# Patient Record
Sex: Male | Born: 1965 | Race: White | Hispanic: No | Marital: Married | Smoking: Never smoker
Health system: Southern US, Community
[De-identification: ages and names within clinical notes are randomized; demographics above are authoritative.]

## PROBLEM LIST (undated history)

## (undated) DIAGNOSIS — M109 Gout, unspecified: Secondary | ICD-10-CM

## (undated) DIAGNOSIS — Z8719 Personal history of other diseases of the digestive system: Secondary | ICD-10-CM

## (undated) DIAGNOSIS — Z8711 Personal history of peptic ulcer disease: Secondary | ICD-10-CM

## (undated) DIAGNOSIS — I10 Essential (primary) hypertension: Secondary | ICD-10-CM

## (undated) DIAGNOSIS — M5135 Other intervertebral disc degeneration, thoracolumbar region: Secondary | ICD-10-CM

## (undated) DIAGNOSIS — G4733 Obstructive sleep apnea (adult) (pediatric): Secondary | ICD-10-CM

## (undated) DIAGNOSIS — S83209A Unspecified tear of unspecified meniscus, current injury, unspecified knee, initial encounter: Secondary | ICD-10-CM

## (undated) DIAGNOSIS — Z9989 Dependence on other enabling machines and devices: Secondary | ICD-10-CM

## (undated) DIAGNOSIS — G4730 Sleep apnea, unspecified: Secondary | ICD-10-CM

## (undated) HISTORY — PX: LAPAROSCOPIC CHOLECYSTECTOMY: SUR755

## (undated) HISTORY — DX: Sleep apnea, unspecified: G47.30

## (undated) HISTORY — DX: Gout, unspecified: M10.9

---
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
## 1987-05-19 HISTORY — PX: ANTERIOR CRUCIATE LIGAMENT REPAIR: SHX115

## 2007-08-25 ENCOUNTER — Encounter: Payer: Self-pay | Admitting: Pulmonary Disease

## 2007-08-29 ENCOUNTER — Ambulatory Visit (HOSPITAL_COMMUNITY): Admission: RE | Admit: 2007-08-29 | Discharge: 2007-08-29 | Payer: Self-pay | Admitting: Cardiovascular Disease

## 2007-08-29 ENCOUNTER — Emergency Department (HOSPITAL_COMMUNITY): Admission: EM | Admit: 2007-08-29 | Discharge: 2007-08-29 | Payer: Self-pay | Admitting: Emergency Medicine

## 2007-08-29 ENCOUNTER — Encounter: Payer: Self-pay | Admitting: Pulmonary Disease

## 2007-09-12 ENCOUNTER — Ambulatory Visit: Payer: Self-pay | Admitting: Pulmonary Disease

## 2007-09-12 DIAGNOSIS — R51 Headache: Secondary | ICD-10-CM

## 2007-09-12 DIAGNOSIS — I499 Cardiac arrhythmia, unspecified: Secondary | ICD-10-CM | POA: Insufficient documentation

## 2007-09-12 DIAGNOSIS — R0602 Shortness of breath: Secondary | ICD-10-CM | POA: Insufficient documentation

## 2007-09-12 DIAGNOSIS — G4730 Sleep apnea, unspecified: Secondary | ICD-10-CM | POA: Insufficient documentation

## 2007-09-12 DIAGNOSIS — R519 Headache, unspecified: Secondary | ICD-10-CM | POA: Insufficient documentation

## 2007-09-12 DIAGNOSIS — I10 Essential (primary) hypertension: Secondary | ICD-10-CM

## 2007-09-19 ENCOUNTER — Inpatient Hospital Stay (HOSPITAL_BASED_OUTPATIENT_CLINIC_OR_DEPARTMENT_OTHER): Admission: RE | Admit: 2007-09-19 | Discharge: 2007-09-19 | Payer: Self-pay | Admitting: Cardiovascular Disease

## 2007-09-22 ENCOUNTER — Ambulatory Visit: Payer: Self-pay | Admitting: Pulmonary Disease

## 2007-09-26 ENCOUNTER — Encounter: Payer: Self-pay | Admitting: Pulmonary Disease

## 2007-09-28 HISTORY — PX: CARDIAC CATHETERIZATION: SHX172

## 2007-10-03 ENCOUNTER — Ambulatory Visit: Admission: RE | Admit: 2007-10-03 | Discharge: 2007-10-03 | Payer: Self-pay | Admitting: Pulmonary Disease

## 2007-10-06 ENCOUNTER — Telehealth (INDEPENDENT_AMBULATORY_CARE_PROVIDER_SITE_OTHER): Payer: Self-pay | Admitting: *Deleted

## 2007-10-17 ENCOUNTER — Telehealth: Payer: Self-pay | Admitting: Pulmonary Disease

## 2007-10-18 ENCOUNTER — Encounter (INDEPENDENT_AMBULATORY_CARE_PROVIDER_SITE_OTHER): Payer: Self-pay | Admitting: *Deleted

## 2007-10-20 ENCOUNTER — Ambulatory Visit (HOSPITAL_COMMUNITY): Admission: RE | Admit: 2007-10-20 | Discharge: 2007-10-20 | Payer: Self-pay | Admitting: Pulmonary Disease

## 2007-10-20 ENCOUNTER — Encounter: Payer: Self-pay | Admitting: Pulmonary Disease

## 2007-10-31 ENCOUNTER — Ambulatory Visit: Payer: Self-pay | Admitting: Internal Medicine

## 2007-11-03 ENCOUNTER — Telehealth: Payer: Self-pay | Admitting: Pulmonary Disease

## 2007-11-16 ENCOUNTER — Ambulatory Visit: Payer: Self-pay | Admitting: Pulmonary Disease

## 2007-12-09 ENCOUNTER — Ambulatory Visit: Payer: Self-pay | Admitting: Pulmonary Disease

## 2008-07-19 ENCOUNTER — Encounter (HOSPITAL_COMMUNITY): Admission: RE | Admit: 2008-07-19 | Discharge: 2008-10-17 | Payer: Self-pay | Admitting: Internal Medicine

## 2010-09-30 NOTE — H&P (Signed)
NAME:  [name], [name] NO.:  [id number]   MEDICAL RECORD NO.:  [id number]           PATIENT TYPE:   LOCATION:                                 FACILITY:   PHYSICIAN:  Vesta Mixer, M.D. DATE OF BIRTH:  1965-06-02   DATE OF ADMISSION:  DATE OF DISCHARGE:                              HISTORY & PHYSICAL   Melvin Ford is a 45 year old gentleman with a history of chest pain and  shortness of breath.  We are admitting him for heart catheterization  after he had an abnormal Cardiolite study.   Melvin Ford is a middle-aged gentleman with a history of obesity, sleep apnea,  and some pulmonary problems.  He started having episodes of chest  tightness and shortness of breath about a month or so ago.  These  episodes are described as severe tightness and shortness of breath with  minimal exercise.  These episodes typically occur with exertion and are  limited greatly.  He denies any PND or orthopnea.  He has been fairly  limited and is not able to do much in the way of any type of exercise.   We performed a stress Cardiolite study which revealed apical ischemia.  He had normal left ventricular systolic function.   He denies any syncope or presyncope.   CURRENT MEDICATIONS:  1. Allopurinol 300 mg a day.  2. Aspirin 81 mg a day.  3. CPAP at night.   ALLERGIES:  NONE.   PAST MEDICAL HISTORY:  1. Sleep apnea.  2. Obesity.   SOCIAL HISTORY:  The patient is a nonsmoker.   FAMILY HISTORY:  Noncontributory.   PHYSICAL EXAMINATION:  GENERAL:  On exam, he is a middle-aged gentleman.  He is in no acute distress.  He is alert and oriented x3, and his mood  and affect are normal.  VITAL SIGNS:  His weight is 335, blood pressure is 128/84 with heart  rate of 82.  HEENT:  Reveals 2+ carotids.  He has no bruits, no JVD, no thyromegaly.  LUNGS:  Clear to auscultation.  HEART:  Regular rate.  S1, S2.  ABDOMEN:  Reveals good bowel sounds and is nontender.  EXTREMITIES:  He has no  clubbing, cyanosis, or edema.  NEURO:  Nonfocal.   Melvin Ford presents with chest pains and some shortness of breath.  His  Cardiolite study is somewhat atypical in that it is a very small area of  ischemia, nevertheless, his symptoms are quite impressive.  I have  suggested that we proceed with a diagnostic heart catheterization.  We  have discussed the risks, benefits, and options of heart  catheterization.  He understands and agrees to proceed.  All of his  other medical problems remain fairly stable.           ______________________________  Vesta Mixer, M.D.     PJN/MEDQ  D:  09/14/2007  T:  09/15/2007  Job:  [id number]   cc:   Pricilla Holm, M.D.

## 2010-09-30 NOTE — H&P (Signed)
NAME:  [name], [name] NO.:  [id number]   MEDICAL RECORD NO.:  [id number]           PATIENT TYPE:   LOCATION:                                 FACILITY:   PHYSICIAN:  Vesta Mixer, M.D. DATE OF BIRTH:  Mar 19, 1966   DATE OF ADMISSION:  09/14/2007  DATE OF DISCHARGE:                              HISTORY & PHYSICAL   Melvin Ford is a middle-aged gentleman with a recent onset of shortness  of breath and chest discomfort.  He recently had a stress Cardiolite  study, which was abnormal.  He is    Dictation ended at this point.           ______________________________  Vesta Mixer, M.D.     PJN/MEDQ  D:  09/14/2007  T:  09/15/2007  Job:  [id number]   cc:   Pricilla Holm, M.D.

## 2010-09-30 NOTE — Cardiovascular Report (Signed)
NAMENIEKO, CLARIN NO.:  [id number]   MEDICAL RECORD NO.:  [id number]          PATIENT TYPE:  OIB   LOCATION:  1962                         FACILITY:  [facility]   PHYSICIAN:  Vesta Mixer, M.D. DATE OF BIRTH:  Sep 01, 1965   DATE OF PROCEDURE:  09/19/2007  DATE OF DISCHARGE:  09/19/2007                            CARDIAC CATHETERIZATION   Melvin Ford is a middle-aged gentleman with a recent onset of chest pain  and shortness of breath.  He is referred for heart catheterization for  further evaluation of his symptoms.   The procedure was left heart catheterization with coronary angiography.  The right femoral artery was easily cannulated using modified Seldinger  technique.  A 4-French sheath was placed.  A Judkins left 5 was used to  cannulate the left coronary artery.  A 85 mL of contrast were used.   HEMODYNAMIC:  Left ventricular pressure is 142/21.  His aortic pressure  is 142/100.   Angiography left main:  The left main is fairly normal.   The left anterior descending artery is a large and tortuous vessel.  It  is fairly smooth and normal throughout its course.  It gives off a  moderate-sized diagonal branch, which is just essentially normal.  The  second diagonal vessel is also a moderate-sized vessel and has a minor  luminal irregularity in the proximal segment.  The remaining part of the  LAD is quite tortuous, but is otherwise normal as it reaches down to the  apex.  There is a small septal branch, which has minor luminal  irregularities.   The left circumflex artery is a very small vessel.  It supplies very  small first obtuse marginal artery, which does not supply much  myocardium.  The AV continuation branch of the circumflex vessel is  fairly small.   The right coronary artery is extremely large and is dominant.  It is  fairly tortuous throughout its course.  It gives off a small to moderate  sized posterior descending artery, which is  unremarkable.  There is a  large posterolateral system, which is very tortuous, but is otherwise  unremarkable.   The left ventriculogram was performed in the 30 RAO position.  It  reveals mild to moderate left ventricular enlargement.  The left  ventricular systolic function is normal.  The ejection fraction is  around 55%.  There is no significant mitral regurgitation.   COMPLICATIONS:  None.   CONCLUSION:  Minimal coronary artery irregularities.  Its vessels are  fairly tortuous, which may be due to longstanding hypertension or  perhaps his obesity.  His overall left ventricular systolic function is  normal.  We will continue with medical management.  He is scheduled to  see the pulmonologist for further evaluation.           ______________________________  Vesta Mixer, M.D.     PJN/MEDQ  D:  09/28/2007  T:  09/29/2007  Job:  [id number]   cc:   Barbaraann Share, MD,FCCP  Bevelyn Buckles. Bensimhon, MD  Pricilla Holm, M.D.

## 2011-02-10 LAB — CBC
HCT: 47
Hemoglobin: 15.9
MCHC: 33.9
RBC: 5.35
RDW: 13.1

## 2011-02-10 LAB — POCT I-STAT, CHEM 8
Chloride: 105
Glucose, Bld: 94
HCT: 48
Potassium: 4.5
Sodium: 137

## 2011-02-10 LAB — B-NATRIURETIC PEPTIDE (CONVERTED LAB): Pro B Natriuretic peptide (BNP): <30

## 2011-02-10 LAB — POCT CARDIAC MARKERS: CKMB, poc: <1 — ABNORMAL LOW

## 2011-06-19 ENCOUNTER — Other Ambulatory Visit: Payer: Self-pay | Admitting: Pain Medicine

## 2011-06-19 NOTE — Progress Notes (Signed)
PT TO COME ON Monday 06-22-11 TO BE WEIGHED . STATES HE IS 340LB AND 6FT AND HAS OSA. WILL DO HEALTH HX AND GIVE INSTRUCTIONS THEN.

## 2011-06-22 ENCOUNTER — Encounter (HOSPITAL_BASED_OUTPATIENT_CLINIC_OR_DEPARTMENT_OTHER): Payer: Self-pay | Admitting: *Deleted

## 2011-06-22 NOTE — Progress Notes (Signed)
NPO AFTER MN WITH EXCEPTION CLEAR LIQUIDS (NO CREAM/ MILD PRODUCTS) UNTIL 0830. ARRIVES AT 1245. NEEDS HH AND EKG. REVIEWED [facility] GUIDELINES, WILL BRING CPAP.

## 2011-06-24 NOTE — Progress Notes (Signed)
REVIEWED CHART W/ DR CARIGNAN MDA, OK TO PROCEED.

## 2011-06-26 ENCOUNTER — Ambulatory Visit (HOSPITAL_BASED_OUTPATIENT_CLINIC_OR_DEPARTMENT_OTHER)
Admission: RE | Admit: 2011-06-26 | Discharge: 2011-06-26 | Disposition: A | Discharge status: Home / Self Care | Payer: 59 | Source: Ambulatory Visit | Attending: Specialist | Admitting: Specialist

## 2011-06-26 ENCOUNTER — Ambulatory Visit (HOSPITAL_BASED_OUTPATIENT_CLINIC_OR_DEPARTMENT_OTHER): Payer: 59 | Admitting: Anesthesiology

## 2011-06-26 ENCOUNTER — Encounter (HOSPITAL_BASED_OUTPATIENT_CLINIC_OR_DEPARTMENT_OTHER): Payer: Self-pay | Admitting: *Deleted

## 2011-06-26 ENCOUNTER — Other Ambulatory Visit: Payer: Self-pay

## 2011-06-26 ENCOUNTER — Encounter (HOSPITAL_BASED_OUTPATIENT_CLINIC_OR_DEPARTMENT_OTHER)
Admission: RE | Disposition: A | Discharge status: Home / Self Care | Payer: Self-pay | Source: Ambulatory Visit | Attending: Specialist

## 2011-06-26 ENCOUNTER — Encounter (HOSPITAL_BASED_OUTPATIENT_CLINIC_OR_DEPARTMENT_OTHER): Payer: Self-pay | Admitting: Anesthesiology

## 2011-06-26 ENCOUNTER — Other Ambulatory Visit: Payer: Self-pay | Admitting: Specialist

## 2011-06-26 DIAGNOSIS — Z79899 Other long term (current) drug therapy: Secondary | ICD-10-CM | POA: Insufficient documentation

## 2011-06-26 DIAGNOSIS — R0602 Shortness of breath: Secondary | ICD-10-CM

## 2011-06-26 DIAGNOSIS — M1710 Unilateral primary osteoarthritis, unspecified knee: Secondary | ICD-10-CM | POA: Insufficient documentation

## 2011-06-26 DIAGNOSIS — I499 Cardiac arrhythmia, unspecified: Secondary | ICD-10-CM

## 2011-06-26 DIAGNOSIS — I10 Essential (primary) hypertension: Secondary | ICD-10-CM | POA: Insufficient documentation

## 2011-06-26 DIAGNOSIS — G4730 Sleep apnea, unspecified: Secondary | ICD-10-CM

## 2011-06-26 DIAGNOSIS — M2240 Chondromalacia patellae, unspecified knee: Secondary | ICD-10-CM | POA: Insufficient documentation

## 2011-06-26 DIAGNOSIS — G4733 Obstructive sleep apnea (adult) (pediatric): Secondary | ICD-10-CM | POA: Insufficient documentation

## 2011-06-26 DIAGNOSIS — S83289A Other tear of lateral meniscus, current injury, unspecified knee, initial encounter: Secondary | ICD-10-CM | POA: Insufficient documentation

## 2011-06-26 DIAGNOSIS — X58XXXA Exposure to other specified factors, initial encounter: Secondary | ICD-10-CM | POA: Insufficient documentation

## 2011-06-26 DIAGNOSIS — M6580 Other synovitis and tenosynovitis, unspecified site: Secondary | ICD-10-CM | POA: Insufficient documentation

## 2011-06-26 DIAGNOSIS — R51 Headache: Secondary | ICD-10-CM

## 2011-06-26 HISTORY — DX: Unspecified tear of unspecified meniscus, current injury, unspecified knee, initial encounter: S83.209A

## 2011-06-26 HISTORY — DX: Essential (primary) hypertension: I10

## 2011-06-26 HISTORY — DX: Dependence on other enabling machines and devices: Z99.89

## 2011-06-26 HISTORY — PX: KNEE ARTHROSCOPY W/ MENISCAL REPAIR: SHX1877

## 2011-06-26 HISTORY — DX: Obstructive sleep apnea (adult) (pediatric): G47.33

## 2011-06-26 HISTORY — DX: Personal history of other diseases of the digestive system: Z87.19

## 2011-06-26 HISTORY — DX: Personal history of peptic ulcer disease: Z87.11

## 2011-06-26 HISTORY — DX: Other intervertebral disc degeneration, thoracolumbar region: M51.35

## 2011-06-26 LAB — POCT HEMOGLOBIN-HEMACUE: Hemoglobin: 15.2 g/dL (ref 13.0–17.0)

## 2011-06-26 SURGERY — ARTHROSCOPY, KNEE, WITH MEDIAL MENISCECTOMY
Anesthesia: General | Site: Knee | Laterality: Left | Wound class: Clean

## 2011-06-26 MED ORDER — CEPHALEXIN 500 MG PO CAPS: 500.0000 mg | ORAL_CAPSULE | Freq: Three times a day (TID) | ORAL | Status: AC

## 2011-06-26 MED ORDER — CHLORHEXIDINE GLUCONATE 4 % EX LIQD: 60.0000 mL | Freq: Once | CUTANEOUS | Status: DC

## 2011-06-26 MED ORDER — LIDOCAINE HCL (CARDIAC) 20 MG/ML IV SOLN
INTRAVENOUS | Status: DC | PRN
  Administered 2011-06-26: 100 mg via INTRAVENOUS

## 2011-06-26 MED ORDER — ONDANSETRON HCL 4 MG/2ML IJ SOLN
INTRAMUSCULAR | Status: DC | PRN
  Administered 2011-06-26: 4 mg via INTRAVENOUS

## 2011-06-26 MED ORDER — POVIDONE-IODINE 7.5 % EX SOLN: Freq: Once | CUTANEOUS | Status: DC

## 2011-06-26 MED ORDER — BUPIVACAINE HCL (PF) 0.25 % IJ SOLN
INTRAMUSCULAR | Status: DC | PRN
  Administered 2011-06-26: 20 mL

## 2011-06-26 MED ORDER — FENTANYL CITRATE 0.05 MG/ML IJ SOLN
25.0000 ug | INTRAMUSCULAR | Status: DC | PRN
  Administered 2011-06-26 (×2): 50 ug via INTRAVENOUS

## 2011-06-26 MED ORDER — LACTATED RINGERS IV SOLN
INTRAVENOUS | Status: DC
  Administered 2011-06-26 (×2): via INTRAVENOUS

## 2011-06-26 MED ORDER — MORPHINE SULFATE 4 MG/ML IJ SOLN
INTRAMUSCULAR | Status: DC | PRN
  Administered 2011-06-26: 4 mg via INTRAVENOUS

## 2011-06-26 MED ORDER — SODIUM CHLORIDE 0.9 % IV SOLN: INTRAVENOUS | Status: DC

## 2011-06-26 MED ORDER — FENTANYL CITRATE 0.05 MG/ML IJ SOLN
INTRAMUSCULAR | Status: DC | PRN
  Administered 2011-06-26 (×2): 50 ug via INTRAVENOUS
  Administered 2011-06-26: 25 ug via INTRAVENOUS
  Administered 2011-06-26 (×2): 50 ug via INTRAVENOUS
  Administered 2011-06-26: 25 ug via INTRAVENOUS
  Administered 2011-06-26: 50 ug via INTRAVENOUS

## 2011-06-26 MED ORDER — HYDROCODONE-ACETAMINOPHEN 5-325 MG PO TABS: 1.0000 | ORAL_TABLET | ORAL | Status: AC | PRN

## 2011-06-26 MED ORDER — PROPOFOL 10 MG/ML IV EMUL
INTRAVENOUS | Status: DC | PRN
  Administered 2011-06-26: 300 mg via INTRAVENOUS

## 2011-06-26 MED ORDER — MIDAZOLAM HCL 5 MG/5ML IJ SOLN
INTRAMUSCULAR | Status: DC | PRN
  Administered 2011-06-26: 2 mg via INTRAVENOUS

## 2011-06-26 MED ORDER — CEFAZOLIN SODIUM-DEXTROSE 2-3 GM-% IV SOLR
2.0000 g | INTRAVENOUS | Status: AC
  Administered 2011-06-26: 2 g via INTRAVENOUS

## 2011-06-26 MED ORDER — PROMETHAZINE HCL 25 MG/ML IJ SOLN: 6.2500 mg | INTRAMUSCULAR | Status: DC | PRN

## 2011-06-26 MED ORDER — DEXAMETHASONE SODIUM PHOSPHATE 4 MG/ML IJ SOLN
INTRAMUSCULAR | Status: DC | PRN
  Administered 2011-06-26: 10 mg via INTRAVENOUS

## 2011-06-26 MED ORDER — SODIUM CHLORIDE 0.9 % IR SOLN
Status: DC | PRN
  Administered 2011-06-26: 9000 mL

## 2011-06-26 SURGICAL SUPPLY — 49 items
BANDAGE ELASTIC 6 VELCRO ST LF (GAUZE/BANDAGES/DRESSINGS) ×3 IMPLANT
BANDAGE ESMARK 6X9 LF (GAUZE/BANDAGES/DRESSINGS) IMPLANT
BANDAGE GAUZE ELAST BULKY 4 IN (GAUZE/BANDAGES/DRESSINGS) ×6 IMPLANT
BLADE 4.2CUDA (BLADE) IMPLANT
BLADE CUDA GRT WHITE 3.5 (BLADE) ×3 IMPLANT
BLADE CUDA SHAVER 3.5 (BLADE) ×3 IMPLANT
BNDG CMPR 9X6 STRL LF SNTH (GAUZE/BANDAGES/DRESSINGS)
BNDG ESMARK 6X9 LF (GAUZE/BANDAGES/DRESSINGS)
CANISTER SUCT LVC 12 LTR MEDI- (MISCELLANEOUS) ×3 IMPLANT
CANISTER SUCTION 1200CC (MISCELLANEOUS) IMPLANT
CLOTH BEACON ORANGE TIMEOUT ST (SAFETY) ×3 IMPLANT
DRAPE ARTHROSCOPY W/POUCH 114 (DRAPES) ×3 IMPLANT
DRAPE INCISE 23X17 IOBAN STRL (DRAPES) ×1
DRAPE INCISE IOBAN 23X17 STRL (DRAPES) ×2 IMPLANT
DRAPE U-SHAPE 47X51 STRL (DRAPES) ×3 IMPLANT
DRSG PAD ABDOMINAL 8X10 ST (GAUZE/BANDAGES/DRESSINGS) ×3 IMPLANT
DRSG XEROFORM 1X8 (GAUZE/BANDAGES/DRESSINGS) ×3 IMPLANT
DURAPREP 26ML APPLICATOR (WOUND CARE) ×3 IMPLANT
ELECT MENISCUS 165MM 90D (ELECTRODE) IMPLANT
ELECT REM PT RETURN 9FT ADLT (ELECTROSURGICAL)
ELECTRODE REM PT RTRN 9FT ADLT (ELECTROSURGICAL) IMPLANT
GAUZE KERLIX 2  STERILE LF (GAUZE/BANDAGES/DRESSINGS) ×3 IMPLANT
GAUZE XEROFORM 1X8 LF (GAUZE/BANDAGES/DRESSINGS) ×3 IMPLANT
GLOVE BIOGEL PI IND STRL 6.5 (GLOVE) ×4 IMPLANT
GLOVE BIOGEL PI INDICATOR 6.5 (GLOVE) ×2
GLOVE INDICATOR 8.0 STRL GRN (GLOVE) ×6 IMPLANT
GLOVE SURG ORTHO 8.0 STRL STRW (GLOVE) ×6 IMPLANT
GOWN PREVENTION PLUS LG XLONG (DISPOSABLE) ×6 IMPLANT
GOWN STRL REIN XL XLG (GOWN DISPOSABLE) ×3 IMPLANT
IMMOBILIZER KNEE 22 UNIV (SOFTGOODS) IMPLANT
IMMOBILIZER KNEE 24 THIGH 36 (MISCELLANEOUS) IMPLANT
IMMOBILIZER KNEE 24 UNIV (MISCELLANEOUS)
IV NS IRRIG 3000ML ARTHROMATIC (IV SOLUTION) ×9 IMPLANT
KNEE WRAP E Z 3 GEL PACK (MISCELLANEOUS) ×3 IMPLANT
MINI VAC (SURGICAL WAND) ×3 IMPLANT
PACK ARTHROSCOPY DSU (CUSTOM PROCEDURE TRAY) ×3 IMPLANT
PACK BASIN DAY SURGERY FS (CUSTOM PROCEDURE TRAY) ×3 IMPLANT
PADDING CAST ABS 4INX4YD NS (CAST SUPPLIES) ×1
PADDING CAST ABS COTTON 4X4 ST (CAST SUPPLIES) ×2 IMPLANT
PENCIL BUTTON HOLSTER BLD 10FT (ELECTRODE) IMPLANT
SET ARTHROSCOPY TUBING (MISCELLANEOUS) ×2
SET ARTHROSCOPY TUBING LN (MISCELLANEOUS) ×2 IMPLANT
SPONGE GAUZE 4X4 12PLY (GAUZE/BANDAGES/DRESSINGS) ×3 IMPLANT
SUT ETHILON 3 0 FSL (SUTURE) ×3 IMPLANT
SYR CONTROL 10ML LL (SYRINGE) ×3 IMPLANT
TOWEL OR 17X24 6PK STRL BLUE (TOWEL DISPOSABLE) ×3 IMPLANT
WAND 30 DEG SABER W/CORD (SURGICAL WAND) IMPLANT
WAND 90 DEG TURBOVAC W/CORD (SURGICAL WAND) IMPLANT
WATER STERILE IRR 500ML POUR (IV SOLUTION) ×3 IMPLANT

## 2011-06-26 NOTE — H&P (Signed)
Melvin Ford is an 46 y.o. male.   Chief Complaint:left knee pain HPI: 6 month h/o left knee pain and severe mechanical sx  Unrelieved with cons measures.  Past Medical History  Diagnosis Date  . Acute meniscal tear of knee left  . OSA on CPAP   . History of gastric ulcer   . History of esophageal stricture   . DDD (degenerative disc disease), thoracolumbar   . Hypertension NO MEDS    Past Surgical History  Procedure Date  . Laparoscopic cholecystectomy 14 yrs ago  . Anterior cruciate ligament repair 1989    RIGHT  . Pilonidal cyst excision 1986  . Cardiac catheterization 09-28-2007    MINIMAL CORONARY ARTERY IRREGULARITIES/ VESSELS ARE FAIRLY TORTOUS (MAY BE DUE TO LONGSTANDING HTN OR OBESITY/ NORMAL LVSF    History reviewed. No pertinent family history. Social History:  reports that he has never smoked. He has never used smokeless tobacco. He reports that he drinks about 12.6 ounces of alcohol per week. He reports that he does not use illicit drugs.  Allergies:  Allergies  Allergen Reactions  . Allopurinol Other (See Comments)    Long-term effect  Muscle atrophy    Medications Prior to Admission  Medication Dose Route Frequency Provider Last Rate Last Dose  . 0.9 %  sodium chloride infusion   Intravenous Continuous Jamelle Rushing, PA      . ceFAZolin (ANCEF) IVPB 2 g/50 mL premix  2 g Intravenous 60 min Pre-Op Jamelle Rushing, PA      . chlorhexidine (HIBICLENS) 4 % liquid 4 application  60 mL Topical Once Jamelle Rushing, [place]      . lactated ringers infusion   Intravenous Continuous Gaetano Hawthorne, MD 50 mL/hr at 06/26/11 1400    . povidone-iodine (BETADINE) 7.5 % scrub   Topical Once Jamelle Rushing, PA       Medications Prior to Admission  Medication Sig Dispense Refill  . celecoxib (CELEBREX) 200 MG capsule Take 200 mg by mouth 2 (two) times daily.        Results for orders placed during the hospital encounter of 06/26/11 (from the past 48 hour(s))  POCT  HEMOGLOBIN-HEMACUE     Status: Normal   Collection Time   06/26/11  1:52 PM      Component Value Range Comment   Hemoglobin 15.2  13.0 - 17.0 (g/dL)    No results found.  ROS  Blood pressure 133/87, pulse 90, temperature 98 F (36.7 C), temperature source Oral, resp. rate 20, height 6' (1.829 m), weight 154.223 kg (340 lb), SpO2 96.00%. Physical Exam left knee small effusion tender med and lat joint lines stable ligs  Assessment/Plan Left knee torn meniscus ,ganglion ,and OA  Left knee arthroscopic debridement Jeronica Stlouis ANDREW 06/26/2011, 2:52 PM

## 2011-06-26 NOTE — Transfer of Care (Signed)
Immediate Anesthesia Transfer of Care Note  Patient: Melvin Ford  Procedure(s) Performed:  KNEE ARTHROSCOPY WITH MEDIAL MENISECTOMY - PARTIAL MEDIAL MENISECTOMY AND DEBRIDEMENT  Patient Location: PACU  Anesthesia Type: General  Level of Consciousness: awake, alert  and oriented  Airway & Oxygen Therapy: Patient Spontanous Breathing and Patient connected to face mask oxygen  Post-op Assessment: Report given to PACU RN and Post -op Vital signs reviewed and stable  Post vital signs: Reviewed and stable  Complications: No apparent anesthesia complications

## 2011-06-26 NOTE — Anesthesia Procedure Notes (Signed)
Procedure Name: LMA Insertion Date/Time: 06/26/2011 3:10 PM Performed by: Huel Coventry Pre-anesthesia Checklist: Patient identified, Emergency Drugs available, Suction available and Patient being monitored Patient Re-evaluated:Patient Re-evaluated prior to inductionOxygen Delivery Method: Circle System Utilized Preoxygenation: Pre-oxygenation with 100% oxygen Intubation Type: IV induction Ventilation: Mask ventilation without difficulty LMA: LMA with gastric port inserted LMA Size: 5.0 Number of attempts: 1 Placement Confirmation: positive ETCO2 Tube secured with: Tape Dental Injury: Teeth and Oropharynx as per pre-operative assessment  Comments: Inserted by Dr. Okey Dupre.

## 2011-06-26 NOTE — Progress Notes (Signed)
IV left hand D/C w tip intact without complications.

## 2011-06-26 NOTE — Op Note (Signed)
Preop diagnosis left knee torn lateral meniscus anterior cruciate ligament ganglion cyst versus synovitis possible torn medial meniscus. Early osteoarthritis   Postoperative diagnosis left knee anterior horn lateral meniscus tear, fat pad synovitis, early osteoarthritis grade 2 chondromalacia of femoral trochlea and grade 2 chondromalacia lateral to the plateau, large symptomatic plica Procedure 1 left knee arthroscopic partial lateral meniscectomy. #2 Hapad synovitis synovectomy #3 plica resection #4 chondroplasty lateral tibial plateau medial femoral trochlea  Surgeon Valma Cava Asst. Kingsley Spittle Anesthesia Gen. and knee block Estimated blood loss less than 50 cc Drains none Complications none Disposition PACU stable  Operative details Patient was met in the holding area cracks site was identified and marked IV was started out was reviewed signed appropriately his and physical was done.  IV Ancef was given all the way to the operating room. In the OR he was laid supine placed under general anesthesia. All extremity well padded left bowel placed about holder Ronald into a well-padded lymphoproliferative from DuraPrep draped in sterile fashion time out was done and confirmed by all involved in the room. The skull portals were established proximal medial inferomedial inferolateral diagnostic arthroscopy with normal tracking of the telephone drawer but a large medial shelf plica which extended to a suprapatellar plica and 8 loss date implied into the fat pad was resected as and tied with 2 shavers. Grade 2 chondromalacia before trochlea contrast will mechanical shaver tracking seemed normal. Anterior cruciate ligament was inspected. Did not appear to show an obvious cyst but the cognitive synovitis in the fat pad region was taken cymba pathologic review. That was debrided back to healthy tissue first meniscal labral identified and protected to cruciate ligament intact. Anterior cruciate ligament  intact. Ankle  Medial compartment inspected articular cartilage healthy the meniscus was thoroughly inspected and probed and did not have her arthroscopic tear going to superiorly inferiorly. No palpable abnormalities arthroscopically.  The lateral meniscus was inspected have degenerative type tearing of the anterior one third is with midrectum healthy edges and a right shaver this to dampen me back system. He did have grade 2, CO2 buttonlike contrast was performed. A 2 inspected there was no the [facility] for an irrigated as [place] was removed. Prior surgical intervention Eilene Ghazi placed into tibial block with Sensorcaine epinephrine. Following the surgery has was closed with 4 nylon suture. They were infiltrated with 20 cc of 0.5% Sensorcaine in the portals and 10 cc of 0.5 Sensorcaine with 4 mg morphine into the knee joint after and confirmed with anesthesia and insertion site. Posterior compressive wrap TED hose last Accu-Chek operating room to the PACU 7 condition. Also note that TED hoses for involving and surgical procedure. He we stabilized the PACU in good be discharged home in stable anesthesiologist.

## 2011-06-26 NOTE — Anesthesia Preprocedure Evaluation (Addendum)
Anesthesia Evaluation  Patient identified by MRN, date of birth, ID band Patient awake    Reviewed: Allergy & Precautions, H&P , NPO status , Patient's Chart, lab work & pertinent test results, reviewed documented beta blocker date and time   Airway Mallampati: III TM Distance: >3 FB Neck ROM: Full    Dental   Pulmonary sleep apnea and Continuous Positive Airway Pressure Ventilation ,  clear to auscultation        Cardiovascular neg cardio ROS Regular Normal Pt denies HTN Hx of arrhythmia caused by allopurinol, resolved   Neuro/Psych Negative Neurological ROS  Negative Psych ROS   GI/Hepatic negative GI ROS, Neg liver ROS,   Endo/Other  Morbid obesity  Renal/GU negative Renal ROS  Genitourinary negative   Musculoskeletal negative musculoskeletal ROS (+)   Abdominal   Peds negative pediatric ROS (+)  Hematology negative hematology ROS (+)   Anesthesia Other Findings   Reproductive/Obstetrics negative OB ROS                           Anesthesia Physical Anesthesia Plan  ASA: III  Anesthesia Plan: General   Post-op Pain Management:    Induction: Intravenous  Airway Management Planned: LMA  Additional Equipment:   Intra-op Plan:   Post-operative Plan: Extubation in OR  Informed Consent: I have reviewed the patients History and Physical, chart, labs and discussed the procedure including the risks, benefits and alternatives for the proposed anesthesia with the patient or authorized representative who has indicated his/her understanding and acceptance.   Dental advisory given  Plan Discussed with: CRNA and Surgeon  Anesthesia Plan Comments:         Anesthesia Quick Evaluation

## 2011-07-06 ENCOUNTER — Other Ambulatory Visit: Payer: Self-pay | Admitting: Oncology

## 2011-07-06 DIAGNOSIS — C189 Malignant neoplasm of colon, unspecified: Secondary | ICD-10-CM

## 2011-07-06 NOTE — Anesthesia Postprocedure Evaluation (Signed)
  Anesthesia Post-op Note  Patient: Melvin Ford  Procedure(s) Performed: Procedure(s) (LRB): KNEE ARTHROSCOPY WITH MEDIAL MENISECTOMY (Left)  Patient Location: PACU  Anesthesia Type: General  Level of Consciousness: awake and alert   Airway and Oxygen Therapy: Patient Spontanous Breathing  Post-op Pain: mild  Post-op Assessment: Post-op Vital signs reviewed, Patient's Cardiovascular Status Stable, Respiratory Function Stable, Patent Airway and No signs of Nausea or vomiting  Post-op Vital Signs: stable  Complications: No apparent anesthesia complications

## 2011-07-08 ENCOUNTER — Telehealth: Payer: Self-pay | Admitting: Oncology

## 2011-07-08 NOTE — Telephone Encounter (Signed)
called pts home lmovm for appt on 02/27 and to rtn call to confirm appt d/t

## 2011-07-08 NOTE — Telephone Encounter (Signed)
pt rtn call and confirmed aptps for 02/27 and pet scan on 07/13/2011.  will fax over a letter to Dr. Thomasena Edis with appt d/t

## 2011-07-13 ENCOUNTER — Encounter (HOSPITAL_COMMUNITY)
Admission: RE | Admit: 2011-07-13 | Discharge: 2011-07-13 | Disposition: A | Discharge status: Home / Self Care | Payer: 59 | Source: Ambulatory Visit | Attending: Oncology | Admitting: Oncology

## 2011-07-13 ENCOUNTER — Other Ambulatory Visit (HOSPITAL_COMMUNITY): Payer: 59

## 2011-07-13 DIAGNOSIS — C189 Malignant neoplasm of colon, unspecified: Secondary | ICD-10-CM

## 2011-07-13 LAB — GLUCOSE, CAPILLARY: Glucose-Capillary: 106 mg/dL — ABNORMAL HIGH (ref 70–99)

## 2011-07-13 MED ORDER — FLUDEOXYGLUCOSE F - 18 (FDG) INJECTION
12.0000 | Freq: Once | INTRAVENOUS | Status: AC | PRN
  Administered 2011-07-13: 12 via INTRAVENOUS

## 2011-07-15 ENCOUNTER — Ambulatory Visit: Payer: 59

## 2011-07-15 ENCOUNTER — Telehealth: Payer: Self-pay | Admitting: Oncology

## 2011-07-15 ENCOUNTER — Other Ambulatory Visit: Payer: 59 | Admitting: Lab

## 2011-07-15 ENCOUNTER — Ambulatory Visit (HOSPITAL_BASED_OUTPATIENT_CLINIC_OR_DEPARTMENT_OTHER): Payer: 59 | Admitting: Lab

## 2011-07-15 ENCOUNTER — Ambulatory Visit (HOSPITAL_BASED_OUTPATIENT_CLINIC_OR_DEPARTMENT_OTHER): Payer: 59 | Admitting: Oncology

## 2011-07-15 VITALS — BP 143/89 | HR 76 | Temp 98.0°F | Ht 72.0 in | Wt 346.1 lb

## 2011-07-15 DIAGNOSIS — C189 Malignant neoplasm of colon, unspecified: Secondary | ICD-10-CM

## 2011-07-15 DIAGNOSIS — C801 Malignant (primary) neoplasm, unspecified: Secondary | ICD-10-CM

## 2011-07-15 DIAGNOSIS — Z800 Family history of malignant neoplasm of digestive organs: Secondary | ICD-10-CM

## 2011-07-15 LAB — COMPREHENSIVE METABOLIC PANEL
AST: 16 U/L (ref 0–37)
Albumin: 4.1 g/dL (ref 3.5–5.2)
Alkaline Phosphatase: 69 U/L (ref 39–117)
Glucose, Bld: 122 mg/dL — ABNORMAL HIGH (ref 70–99)
Potassium: 4.1 mEq/L (ref 3.5–5.3)
Sodium: 139 mEq/L (ref 135–145)
Total Protein: 6.4 g/dL (ref 6.0–8.3)

## 2011-07-15 LAB — CBC WITH DIFFERENTIAL/PLATELET
Eosinophils Absolute: 0.3 10*3/uL (ref 0.0–0.5)
MCV: 85.4 fL (ref 79.3–98.0)
MONO%: 4.9 % (ref 0.0–14.0)
NEUT#: 6.6 10*3/uL — ABNORMAL HIGH (ref 1.5–6.5)
RBC: 4.65 10*6/uL (ref 4.20–5.82)
RDW: 13.2 % (ref 11.0–14.6)
WBC: 9 10*3/uL (ref 4.0–10.3)
lymph#: 1.6 10*3/uL (ref 0.9–3.3)

## 2011-07-15 NOTE — Telephone Encounter (Signed)
L/m with linda w/ dr Arlyce Dice re np appt,dr gbs had talked with dr Arlyce Dice this morning.  Pt to be worked in asap for colon/endo    aom

## 2011-07-15 NOTE — Telephone Encounter (Signed)
Opened in error

## 2011-07-15 NOTE — Progress Notes (Signed)
Referring MD: Lorene Dy 46 y.o.  1965-06-09    Reason for Referral: Highly atypical glandular epithelium involving the left knee synovium/     HPI: He developed left knee pain in the summer of 2012. He reports no clinical improvement following 2 "steroid injections "by Dr. Thomasena Edis. An MRI of the left knee on 05/25/2011 revealed an anterior cruciate ligament ganglion cyst, a tear through the lateral meniscus, and an interest/para meniscal cyst. No worrisome bone lesion.  He was taken to the operating room by Dr. Thomasena Edis on 06/26/2011 and underwent a left knee arthroscopy with a partial lateral meniscectomy and a synovitis synovectomy, plica resection, and chondroplastic at the lateral tibia plateau. A large medial shelf plica was resected and an area of synovitis in the fat pad region was resected.  The pathology ([id number]) revealed multiple foci of highly atypical glands with an inflamed synovial tissue. The glands stained positive for CD X2 and cytokeratin 20. A cytokeratin 7 stain was negative and a calretinin stain was negative. The immunophenotype was felt to be consistent with colorectal differentiation.  He continues to have a throbbing pain in the left knee. He reports feeling well prior to surgery. He has noted nausea during the day for the past several months. This is relieved with eating.  He reports undergoing a negative colonoscopy in [place] approximately 5 years ago.  Past Medical History  Diagnosis Date  . Acute meniscal tear of knee-left   January 2013   . OSA on CPAP   . History of gastric ulcer   . History of esophageal stricture   . DDD (degenerative disc disease), thoracolumbar   . Hypertension NO MEDS   .Gout  . 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  Past Surgical History  Procedure Date  . Laparoscopic cholecystectomy 14 yrs ago  . Anterior cruciate ligament repair 1989    RIGHT  . Pilonidal cyst excision 1986  . Cardiac catheterization 09-28-2007    MINIMAL CORONARY ARTERY IRREGULARITIES/ VESSELS ARE FAIRLY TORTOUS (MAY BE DUE TO LONGSTANDING HTN OR OBESITY/ NORMAL LVSF    Family history: His mother died of colon cancer at age 17, his maternal grandmother died of colon cancer a 35, his paternal great-grandmother had a "intestinal cancer" at age 58. He reports a maternal first and second cousin with colon cancer. There are also maternal cousins with lung cancer (smokers). No other family history of cancer.  Current outpatient prescriptions:acetaminophen (TYLENOL) 500 MG tablet, Take 1,000 mg by mouth 2 (two) times daily as needed. 1000-1500 mg, Disp: , Rfl: ;  celecoxib (CELEBREX) 200 MG capsule, Take 200 mg by mouth every other day. , Disp: , Rfl: ;  diphenhydrAMINE (BENADRYL) 25 MG tablet, Take 50 mg by mouth at bedtime as needed., Disp: , Rfl:  ibuprofen (ADVIL,MOTRIN) 200 MG tablet, Take 800 mg by mouth every 6 (six) hours as needed., Disp: , Rfl:   Allergies:  Allergies  Allergen Reactions  . Allopurinol Other (See Comments)    Long-term effect  Muscle atrophy    Social History:   [place] in [place]. He is married. 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 He does not use cigarettes. He drinks one to 2 beers per day. He has used marijuana in the  past. He reports no transfusion history. He denies risk factors for HIV and hepatitis.  History  Alcohol Use  . 12.6 oz/week  . 21 Glasses of wine per week    PT OWNS/ RUNS BE[employer]    History  Smoking status  . Never Smoker   Smokeless tobacco  . Never Used     ROS:   Positives include: Nausea for the past 3 months, relieved with eating, chronic dysphagia secondary to an esophageal  stricture, firm brownish discoloration at the right lower leg for the past 6-9 months. Face rash.  A complete ROS was otherwise negative.  Physical Exam:  Blood pressure 143/89, pulse 76, temperature 98 F (36.7 C), temperature source Oral, height 6' (1.829 m), weight 346 lb 1.6 oz (156.99 kg).  HEENT: Oropharynx without visible mass, neck without mass Lungs: Clear bilaterally Cardiac: Regular rate and rhythm Abdomen: Nontender, no hepatosplenic weight, no mass GU: Testes without mass  Vascular: Superficial varicosities over both legs. No edema. There is a plaque-like area of firm induration with a brownish discoloration at the medial right lower leg. Lymph nodes: No cervical, supraclavicular, axillary, or inguinal nodes Neurologic: Alert and oriented. The motor examination appears grossly intact. Skin: There is a 1-2 cm raised 10 lesion at the left retroauricular area Musculoskeletal: Mild swelling surrounding the left knee joint.   LAB:  CBC  Lab Results  Component Value Date   WBC 9.0 07/15/2011   HGB 13.7 07/15/2011   HCT 39.7 07/15/2011   MCV 85.4 07/15/2011   PLT 265 07/15/2011     CMP      Component Value Date/Time   NA 137 08/29/2007 1248   K 4.5 08/29/2007 1248   CL 105 08/29/2007 1248   GLUCOSE 94 08/29/2007 1248   BUN 11 08/29/2007 1248   CREATININE 0.9 08/29/2007 1248   CEA pending   Radiology: PET scan 07/13/2011-mild low level hypermetabolism about the suprapatellar region of the left knee. This corresponds to overlying skin thickening and subcutaneous edema-likely postoperative.   Assessment/Plan:   1. 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  2. Family history of colorectal cancer  3. 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 Status post left knee arthroscopy 06/26/2011  5. Gout  6. Hypertension  7. History of esophageal  stricture  8. Plaque-like induration at the right lower medial leg-likely thrombosed superficial varices  9. History of esophageal stricture  10. Nausea for the past 3 months  11. Chronic back pain    Disposition:   He has been diagnosed with atypical glandular epithelium at the left knee joint. This would be a very unusual presentation of metastatic colorectal carcinoma or adenocarcinoma from another site.  His case was reviewed at the GI tumor conference this morning. The consensus opinion was to proceed with an endoscopic evaluation. We will make a referral to Dr. Arlyce Dice for an upper and lower endoscopy. The CEA level from today is pending.  He has a significant family history of colorectal cancer. We will consider testing for hereditary non-polyposis colon cancer depending on the endoscopy findings.  We will plan for an outside review of the surgical pathology prior to finalizing a treatment plan.   Melvin Ford 07/15/2011, 3:25 PM

## 2011-07-16 ENCOUNTER — Telehealth: Payer: Self-pay | Admitting: *Deleted

## 2011-07-16 NOTE — Telephone Encounter (Signed)
Phone call to patient, per request of Dr. Truett Perna, to let him know his CEA level from 07/15/11 was normal at 1.1 Patient was without further questions and plans to f/u with MD after he has his colonoscopy.

## 2011-07-24 ENCOUNTER — Ambulatory Visit (INDEPENDENT_AMBULATORY_CARE_PROVIDER_SITE_OTHER): Payer: 59 | Admitting: Gastroenterology

## 2011-07-24 ENCOUNTER — Encounter: Payer: Self-pay | Admitting: Gastroenterology

## 2011-07-24 DIAGNOSIS — K222 Esophageal obstruction: Secondary | ICD-10-CM | POA: Insufficient documentation

## 2011-07-24 DIAGNOSIS — C801 Malignant (primary) neoplasm, unspecified: Secondary | ICD-10-CM

## 2011-07-24 DIAGNOSIS — Z800 Family history of malignant neoplasm of digestive organs: Secondary | ICD-10-CM

## 2011-07-24 MED ORDER — PEG-KCL-NACL-NASULF-NA ASC-C 100 G PO SOLR: 1.0000 | Freq: Once | ORAL | Status: DC

## 2011-07-24 NOTE — Assessment & Plan Note (Signed)
He has symptoms from a recurrence of the stricture.  Recommendations #1 upper endoscopy with balloon dilatation as indicated  Risks, alternatives, and complications of the procedure, including bleeding, perforation, and possible need for surgery, were explained to the patient.  Patient's questions were answered.

## 2011-07-24 NOTE — Assessment & Plan Note (Addendum)
He has a strong family history of colorectal cancer. Should he have polyps or neoplasm then genetic testing would be recommended.

## 2011-07-24 NOTE — Patient Instructions (Signed)
Your Colonoscopy is scheduled on 08/03/2011 at 1:30pm Separate instructions have been given You MoviPrep has been sent to your pharmacy You will need to contact the office when you are ready to schedule your Hospital Endoscopy with Propofol Sedation and Balloon Dilation Ask for Selinda Michaels or Merri Ray to get this hospital procedure scheduled

## 2011-07-24 NOTE — Progress Notes (Signed)
History of Present Illness:  Melvin Ford is a pleasant 46 year old white male referred at the request of Dr. Myrle Sheng or consideration of colonoscopy. He underwent arthroscopic surgery which demonstrated abnormal epithelial cells on the meniscus suggesting a colorectal cancer primary. He denies lower GI symptoms including change of bowel habits, abdominal pain, melena or hematochezia. Family history is notable for his mother with colon cancer at 18, and his maternal grandmother. His maternal great grandmother had "intestinal cancer" and a couple of distant relatives had colon cancer. He has a history of an esophageal stricture which has been dilated several times in the past. He complains of dysphagia to solids. He underwent colonoscopy in 2007 that apparently was normal. His last esophageal dilatation was also in 2007. Recent CEA was normal as was his CBC.    Past Medical History  Diagnosis Date  . Acute meniscal tear of knee left  . OSA on CPAP   . History of gastric ulcer   . History of esophageal stricture   . DDD (degenerative disc disease), thoracolumbar   . Hypertension NO MEDS  . Sleep apnea    Past Surgical History  Procedure Date  . Laparoscopic cholecystectomy 14 yrs ago  . Anterior cruciate ligament repair 1989    RIGHT  . Pilonidal cyst excision 1986  . 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 Current Outpatient Prescriptions  Medication Sig Dispense Refill  . acetaminophen (TYLENOL) 500 MG tablet Take 1,000 mg by mouth 2 (two) times daily as needed. 1000-1500 mg      . celecoxib (CELEBREX) 200 MG capsule Take 200 mg by mouth every  other day.       . diphenhydrAMINE (BENADRYL) 25 MG tablet Take 50 mg by mouth at bedtime as needed.      Marland Kitchen ibuprofen (ADVIL,MOTRIN) 200 MG tablet Take 800 mg by mouth every 6 (six) hours as needed.       Allergies as of 07/24/2011 - Review Complete 07/24/2011  Allergen Reaction Noted  . Allopurinol Other (See Comments) 06/22/2011    reports that he has never smoked. He has never used smokeless tobacco. He reports that he drinks about 12.6 ounces of alcohol per week. He reports that he does not use illicit drugs.     Review of Systems: Pertinent positive and negative review of systems were noted in the above HPI section. All other review of systems were otherwise negative.  Vital signs were reviewed in today's medical record Physical Exam: General: Well developed , well nourished, no acute distress Head: Normocephalic and atraumatic Eyes:  sclerae anicteric, EOMI Ears: Normal auditory acuity Mouth: No deformity or lesions Neck: Supple, no masses or thyromegaly Lungs: Clear throughout to auscultation Heart: Regular rate and rhythm; no murmurs, rubs or bruits Abdomen: Soft, non tender and non distended. No masses, hepatosplenomegaly or hernias noted. Normal Bowel sounds Rectal:deferred Musculoskeletal: Symmetrical with no gross deformities  Skin: No lesions on visible extremities Pulses:  Normal pulses noted Extremities: No clubbing, cyanosis, edema or deformities noted Neurological: Alert oriented x 4, grossly nonfocal Cervical Nodes:  No significant cervical adenopathy Inguinal Nodes: No significant inguinal adenopathy Psychological:  Alert and cooperative. Normal mood and affect

## 2011-07-24 NOTE — Assessment & Plan Note (Addendum)
Pt has a strong family history of colon cancer. CEA level is normal  Recommendations #1 colonoscopy  Amendment: Due to an error in Pathology, the incorrect original diagnosis of Highly Atypical Glandular Epithelial Deposits was assigned to Melvin Ford's case.  See amended final Pathology report.

## 2011-07-27 ENCOUNTER — Telehealth: Payer: Self-pay | Admitting: Oncology

## 2011-07-27 NOTE — Telephone Encounter (Signed)
pt called lmovm to cancel appt for 03/18 and he will rtn call to r/s appt

## 2011-08-03 ENCOUNTER — Encounter: Payer: Self-pay | Admitting: Gastroenterology

## 2011-08-03 ENCOUNTER — Ambulatory Visit: Payer: 59 | Admitting: Oncology

## 2011-08-03 ENCOUNTER — Ambulatory Visit (AMBULATORY_SURGERY_CENTER): Payer: 59 | Admitting: Gastroenterology

## 2011-08-03 VITALS — BP 119/86 | HR 57 | Temp 96.2°F | Resp 20 | Ht 72.0 in | Wt 342.0 lb

## 2011-08-03 DIAGNOSIS — K2970 Gastritis, unspecified, without bleeding: Secondary | ICD-10-CM

## 2011-08-03 DIAGNOSIS — Z1211 Encounter for screening for malignant neoplasm of colon: Secondary | ICD-10-CM

## 2011-08-03 DIAGNOSIS — C801 Malignant (primary) neoplasm, unspecified: Secondary | ICD-10-CM

## 2011-08-03 DIAGNOSIS — Z800 Family history of malignant neoplasm of digestive organs: Secondary | ICD-10-CM

## 2011-08-03 DIAGNOSIS — R1310 Dysphagia, unspecified: Secondary | ICD-10-CM

## 2011-08-03 DIAGNOSIS — K222 Esophageal obstruction: Secondary | ICD-10-CM

## 2011-08-03 DIAGNOSIS — K2990 Gastroduodenitis, unspecified, without bleeding: Secondary | ICD-10-CM

## 2011-08-03 MED ORDER — SODIUM CHLORIDE 0.9 % IV SOLN: 500.0000 mL | INTRAVENOUS | Status: DC

## 2011-08-03 NOTE — Progress Notes (Signed)
Patient did not experience any of the following events: a burn prior to discharge; a fall within the facility; wrong site/side/patient/procedure/implant event; or a hospital transfer or hospital admission upon discharge from the facility. (G8907) Patient did not have preoperative order for IV antibiotic SSI prophylaxis. (G8918)  

## 2011-08-03 NOTE — Op Note (Addendum)
[facility] [address] [place], [place]  [address]  COLONOSCOPY PROCEDURE REPORT  PATIENT:  Melvin Ford, Melvin Ford  MR#:  [id number] BIRTHDATE:  December 14, 1965, 46 yrs. old  GENDER:  male ENDOSCOPIST:  Barbette Hair. Arlyce Dice, MD REF. BY:  Lavada Mesi. Truett Perna, M.D. PROCEDURE DATE:  08/03/2011 PROCEDURE:  Diagnostic Colonoscopy ASA CLASS:  Class I INDICATIONS:  family history of colon cancer Mother and maternal grandmother MEDICATIONS:   MAC sedation, administered by CRNA propofol 300mg IV  DESCRIPTION OF PROCEDURE:   After the risks benefits and alternatives of the procedure were thoroughly explained, informed consent was obtained.  Digital rectal exam was performed and revealed no abnormalities.   The LB CF-H180AL [id number] endoscope was introduced through the anus and advanced to the cecum, which was identified by both the appendix and ileocecal valve, without limitations.  The quality of the prep was excellent, using MoviPrep.  The instrument was then slowly withdrawn as the colon was fully examined. <<PROCEDUREIMAGES>>  FINDINGS:  A normal appearing cecum, ileocecal valve, and appendiceal orifice were identified. The ascending, hepatic flexure, transverse, splenic flexure, descending, sigmoid colon, and rectum appeared unremarkable (see image2, image1, and image4). Retroflexed views in the rectum revealed no abnormalities.    The time to cecum =  1) 2.0  minutes. The scope was then withdrawn in 1) 8.0  minutes from the cecum and the procedure completed. COMPLICATIONS:  None ENDOSCOPIC IMPRESSION: 1) Normal colon RECOMMENDATIONS: 1) Given your significant family history of colon cancer, you should have a repeat colonoscopy in 5 years REPEAT EXAM:  In 5 year(s) for Colonoscopy.  ______________________________ Barbette Hair. Arlyce Dice, MD  CC:  n. REVISED:  11/02/2011 11:57 AM eSIGNED:   Barbette Hair. Roshawn Lacina at 11/02/2011 11:57 AM  Melvin Ford, [phone]

## 2011-08-03 NOTE — Op Note (Addendum)
[facility] [address] [place], [place]  [address]  ENDOSCOPY PROCEDURE REPORT  PATIENT:  Ford, Melvin  MR#:  [id number] BIRTHDATE:  1966/04/12, 46 yrs. old  GENDER:  male  ENDOSCOPIST:  Barbette Hair. Arlyce Dice, MD Referred by:  Lavada Mesi Truett Perna, M.D.  PROCEDURE DATE:  08/03/2011 PROCEDURE:  EGD with biopsy, [address], Maloney Dilation of Esophagus ASA CLASS:  Class I INDICATIONS:  dysphagia h/o esophageal stricture  MEDICATIONS:   There was residual sedation effect present from prior procedure., MAC sedation, administered by CRNA propofol 100mg IV, glycopyrrolate (Robinal) 0.2 mg IV, 0.6cc simethancone 0.6 cc PO TOPICAL ANESTHETIC:  DESCRIPTION OF PROCEDURE:   After the risks and benefits of the procedure were explained, informed consent was obtained.  The LB GIF-H180 [id number] endoscope was introduced through the mouth and advanced to the third portion of the duodenum.  The instrument was slowly withdrawn as the mucosa was fully examined. <<PROCEDUREIMAGES>>  A stricture was found at the gastroesophageal junction (see image2). Moderate stricture Dilation with maloney dilator 18mm Moderate resistance; no heme  Multiple erosions were found in the antrum. Multiple superficial erosions. Bxs taken (see image4). Otherwise the examination was normal (see image3 and image5). Retroflexed views revealed no abnormalities.    The scope was then withdrawn from the patient and the procedure completed.  COMPLICATIONS:  None  ENDOSCOPIC IMPRESSION: 1) Stricture at the gastroesophageal junction - s/p maloney dilitation 2) Erosions, multiple in the antrum 3) Otherwise normal examination RECOMMENDATIONS: 1) repeat dilatations as needed 2) Await biopsy results  ______________________________ Barbette Hair. Arlyce Dice, MD  CC:  n. REVISED:  11/02/2011 11:58 AM eSIGNED:   Barbette Hair. Margarit Minshall at 11/02/2011 11:58 AM  [name], [phone]

## 2011-08-03 NOTE — Patient Instructions (Signed)

## 2011-08-04 ENCOUNTER — Telehealth: Payer: Self-pay | Admitting: *Deleted

## 2011-08-04 ENCOUNTER — Ambulatory Visit: Payer: 59 | Admitting: Gastroenterology

## 2011-08-04 NOTE — Telephone Encounter (Signed)
Left message

## 2011-08-07 ENCOUNTER — Telehealth: Payer: Self-pay | Admitting: Oncology

## 2011-08-07 NOTE — Telephone Encounter (Signed)
pt called and r/s cancelled appt on 3-18 to 4-01

## 2011-08-12 ENCOUNTER — Encounter: Payer: Self-pay | Admitting: Gastroenterology

## 2011-08-17 ENCOUNTER — Telehealth: Payer: Self-pay | Admitting: Oncology

## 2011-08-17 ENCOUNTER — Ambulatory Visit (HOSPITAL_BASED_OUTPATIENT_CLINIC_OR_DEPARTMENT_OTHER): Payer: 59 | Admitting: Oncology

## 2011-08-17 VITALS — BP 141/93 | HR 101 | Temp 97.4°F | Ht 72.0 in | Wt 342.2 lb

## 2011-08-17 DIAGNOSIS — M109 Gout, unspecified: Secondary | ICD-10-CM

## 2011-08-17 DIAGNOSIS — Z800 Family history of malignant neoplasm of digestive organs: Secondary | ICD-10-CM

## 2011-08-17 DIAGNOSIS — C801 Malignant (primary) neoplasm, unspecified: Secondary | ICD-10-CM

## 2011-08-17 NOTE — Telephone Encounter (Signed)
Gv pt appt for [id number] °

## 2011-08-17 NOTE — Progress Notes (Signed)
OFFICE PROGRESS NOTE   INTERVAL HISTORY:   He returns as scheduled. He reports improvement in the left knee pain/swelling since receiving a postoperative steroid injection. He is scheduled to see Dr. Thomasena Edis in approximately 2 weeks. He otherwise feels well.  He underwent an upper endoscopy and colonoscopy by Dr. Arlyce Dice on 08/03/2011. The colonoscopy was normal. There was gastritis and a GE junction stricture. The stricture was dilated. Biopsies from the stomach revealed chronic gastritis.    Objective:  Vital signs in last 24 hours:  Blood pressure 141/93, pulse 101, temperature 97.4 F (36.3 C), temperature source Oral, height 6' (1.829 m), weight 342 lb 3.2 oz (155.221 kg).    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  X-rays: PET scan on 07/13/2011-low-level hypermetabolism about the suprapatellar region of the left knee, likely postoperative change. No abnormal activity in the neck, chest, abdomen, or pelvis. Medications: I have reviewed the patient's current medications.  Assessment/Plan: 1. 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  2. Family history of colorectal cancer  3. 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 Status post left knee arthroscopy 06/26/2011  5. Gout  6. Hypertension  7. History of esophageal stricture  8. Plaque-like induration at the right lower medial leg-likely thrombosed superficial varices  9. History of esophageal stricture  10. Nausea for the past 3 months  11. Chronic back pain    Disposition:  An extensive diagnostic evaluation for evidence of a primary malignancy has been negative. This includes a negative PET  scan, negative upper/lower endoscopy evaluation, and a normal CEA. I discussed the case with Dr.Rund. He has submitted the synovial tissue for an outside pathology opinion.  Melvin Ford will be referred to the genetics counselor to discuss his family history and decide on the indication for genetic testing.  We have not made a definitive diagnosis of malignancy. He will return for an office visit in 3 months. We will consider repeat imaging of the left knee if the outside pathologist concurs with a malignant diagnosis.   Lucile Shutters, MD  08/17/2011  5:21 PM

## 2011-08-25 ENCOUNTER — Telehealth: Payer: Self-pay | Admitting: Oncology

## 2011-08-25 NOTE — Telephone Encounter (Signed)
S/w pt today. Pt has declined genetics referral and cx'd 7/1 f/u. Per pt he is not going to reschedule at this time. Staff message sent to [facility] via EPIC.

## 2011-09-01 NOTE — ED Provider Notes (Signed)
Order(s) created erroneously. Erroneous order ID: [id number] Order moved by: WILLIAMS, AMY Order move date/time: 09/01/2011  9:52 AM Source Patient:    [id number] Source Contact: 06/26/2011 Destination Patient:   [id number] Destination Contact: 09/01/2011

## 2011-11-16 ENCOUNTER — Ambulatory Visit: Payer: 59 | Admitting: Oncology

## 2012-01-29 ENCOUNTER — Encounter: Payer: Self-pay | Admitting: Cardiovascular Disease

## 2012-10-14 ENCOUNTER — Ambulatory Visit: Payer: 59 | Admitting: Emergency Medicine

## 2012-10-14 VITALS — BP 170/100 | HR 96 | Temp 99.0°F | Resp 16 | Ht 71.5 in | Wt 358.0 lb

## 2012-10-14 DIAGNOSIS — L219 Seborrheic dermatitis, unspecified: Secondary | ICD-10-CM

## 2012-10-14 DIAGNOSIS — I10 Essential (primary) hypertension: Secondary | ICD-10-CM

## 2012-10-14 DIAGNOSIS — M549 Dorsalgia, unspecified: Secondary | ICD-10-CM

## 2012-10-14 MED ORDER — KETOCONAZOLE-HYDROCORTISONE 2 & 1 % (CREAM) EX KIT: 1.0000 "application " | PACK | Freq: Two times a day (BID) | CUTANEOUS | Status: DC

## 2012-10-14 MED ORDER — HYDROCODONE-ACETAMINOPHEN 5-325 MG PO TABS: 1.0000 | ORAL_TABLET | ORAL | Status: DC | PRN

## 2012-10-14 MED ORDER — KETOCONAZOLE 2 % EX SHAM: MEDICATED_SHAMPOO | CUTANEOUS | Status: DC

## 2012-10-14 MED ORDER — DICLOFENAC SODIUM ER 100 MG PO TB24: 100.0000 mg | ORAL_TABLET | Freq: Every day | ORAL | Status: DC

## 2012-10-14 NOTE — Progress Notes (Signed)
[facility] [address] [place] [address] [phone]  Date:  10/14/2012   Name:  Melvin Ford   DOB:  [birth year]   MRN:  [id number]  PCP:  No PCP Per Patient    Chief Complaint: Back Pain   History of Present Illness:  Melvin Ford is a 47 y.o. very pleasant male patient who presents with the following:  Two problems.  Has low back pain that radiates into his left hip posteriorly. No neuro symptoms.  No history of injury.  Works in [employer], [employer] with a lot of lifting.   Has seborrheic dermatitis and rosacea that is currently not under treatment. History of hypertension not under treatment Morbid obesity   Patient Active Problem List   Diagnosis Date Noted  . Family history of malignant neoplasm of gastrointestinal tract 07/24/2011  . Stricture and stenosis of esophagus 07/24/2011  . HYPERTENSION 09/12/2007  . ABNORMAL HEART RHYTHMS 09/12/2007  . SLEEP APNEA 09/12/2007  . HEADACHE, CHRONIC 09/12/2007  . DYSPNEA 09/12/2007    Past Medical History  Diagnosis Date  . Acute meniscal tear of knee left  . OSA on CPAP   . History of gastric ulcer   . History of esophageal stricture   . DDD (degenerative disc disease), thoracolumbar   . Hypertension NO MEDS  . Sleep apnea   . Gout     Past Surgical History  Procedure Laterality Date  . Laparoscopic cholecystectomy  14 yrs ago  . Anterior cruciate ligament repair  1989    RIGHT  . Pilonidal cyst excision  1986  . 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 Knee arthroscopy w/ meniscal repair  06-26-11    left    History  Substance Use Topics  . Smoking status: Never Smoker   . Smokeless tobacco: Never Used  . Alcohol Use: 12.6 oz/week    21 Glasses of wine per week     Comment: PT OWNS/ RUNS [employer]    Family History  Problem Relation Age of  Onset  . Colon cancer Mother 55  . Diabetes Mother   . Colon cancer Maternal Grandmother 50  . Diabetes Paternal Grandfather   . Heart disease Paternal Grandfather   . Irritable bowel syndrome Paternal Grandmother     Allergies  Allergen Reactions  . Allopurinol Other (See Comments)    Long-term effect  Muscle atrophy    Medication list has been reviewed and updated.  Current Outpatient Prescriptions on File Prior to Visit  Medication Sig Dispense Refill  . traMADol (ULTRAM) 50 MG tablet Take 50 mg by mouth Every 6 hours as needed.       No current facility-administered medications on file prior to visit.    Review of Systems:  As per HPI, otherwise negative.    Physical Examination: Filed Vitals:   10/14/12 1311  BP: 170/100  Pulse: 96  Temp: 99 F (37.2 C)  Resp: 16   Filed Vitals:   10/14/12 1311  Height: 5' 11.5" (1.816 m)  Weight: 358 lb (162.388 kg)   Body mass index is 49.24 kg/(m^2). Ideal Body Weight: Weight in (lb) to have BMI = 25: 181.4  GEN: WDWN, NAD, Non-toxic, A & O x 3 HEENT: Atraumatic, Normocephalic. Neck supple. No masses, No LAD. Ears and Nose: No external deformity. CV: RRR, No M/G/R.  No JVD. No thrill. No extra heart sounds. PULM: CTA B, no wheezes, crackles, rhonchi. No retractions. No resp. distress. No accessory muscle use. ABD: S, NT, ND, +BS. No rebound. No HSM. EXTR: No c/c/e NEURO Normal gait.  PSYCH: Normally interactive. Conversant. Not depressed or anxious appearing.  Calm demeanor.  SKIN:  Seborrheic dermatitis that is rather severe on face BACK:  Tender lumbar region  Assessment and Plan: Morbid obesity Seborrheic dermatitis Elevated blood pressure Back pain   Signed,  Phillips Odor, MD

## 2012-10-14 NOTE — Patient Instructions (Signed)
Hypertension As your heart beats, it forces blood through your arteries. This force is your blood pressure. If the pressure is too high, it is called hypertension (HTN) or high blood pressure. HTN is dangerous because you may have it and not know it. High blood pressure may mean that your heart has to work harder to pump blood. Your arteries may be narrow or stiff. The extra work puts you at risk for heart disease, stroke, and other problems.  Blood pressure consists of two numbers, a higher number over a lower, 110/72, for example. It is stated as "110 over 72." The ideal is below 120 for the top number (systolic) and under 80 for the bottom (diastolic). Write down your blood pressure today. You should pay close attention to your blood pressure if you have certain conditions such as:  Heart failure.  Prior heart attack.  Diabetes  Chronic kidney disease.  Prior stroke.  Multiple risk factors for heart disease. To see if you have HTN, your blood pressure should be measured while you are seated with your arm held at the level of the heart. It should be measured at least twice. A one-time elevated blood pressure reading (especially in the Emergency Department) does not mean that you need treatment. There may be conditions in which the blood pressure is different between your right and left arms. It is important to see your caregiver soon for a recheck. Most people have essential hypertension which means that there is not a specific cause. This type of high blood pressure may be lowered by changing lifestyle factors such as:  Stress.  Smoking.  Lack of exercise.  Excessive weight.  Drug/tobacco/alcohol use.  Eating less salt. Most people do not have symptoms from high blood pressure until it has caused damage to the body. Effective treatment can often prevent, delay or reduce that damage. TREATMENT  When a cause has been identified, treatment for high blood pressure is directed at the  cause. There are a large number of medications to treat HTN. These fall into several categories, and your caregiver will help you select the medicines that are best for you. Medications may have side effects. You should review side effects with your caregiver. If your blood pressure stays high after you have made lifestyle changes or started on medicines,   Your medication(s) may need to be changed.  Other problems may need to be addressed.  Be certain you understand your prescriptions, and know how and when to take your medicine.  Be sure to follow up with your caregiver within the time frame advised (usually within two weeks) to have your blood pressure rechecked and to review your medications.  If you are taking more than one medicine to lower your blood pressure, make sure you know how and at what times they should be taken. Taking two medicines at the same time can result in blood pressure that is too low. SEEK IMMEDIATE MEDICAL CARE IF:  You develop a severe headache, blurred or changing vision, or confusion.  You have unusual weakness or numbness, or a faint feeling.  You have severe chest or abdominal pain, vomiting, or breathing problems. MAKE SURE YOU:   Understand these instructions.  Will watch your condition.  Will get help right away if you are not doing well or get worse. Document Released: 05/04/2005 Document Revised: 07/27/2011 Document Reviewed: 12/23/2007 [facility] Patient Information 2014 [place], [place]. Seborrheic Dermatitis Seborrheic dermatitis involves pink or red skin with greasy, flaky scales. This is often  found on the scalp, eyebrows, nose, bearded area, and on or behind the ears. It can also occur on the central chest. It often occurs where there are more oil (sebaceous) glands. This condition is also known as dandruff. When this condition affects a baby's scalp, it is called cradle cap. It may come and go for no known reason. It can occur at any time of life  from infancy to old age. CAUSES  The cause is unknown. It is not the result of too little moisture or too much oil. In some people, seborrheic dermatitis flare-ups seem to be triggered by stress. It also commonly occurs in people with certain diseases such as Parkinson's disease or HIV/AIDS. SYMPTOMS   Thick scales on the scalp.  Redness on the face or in the armpits.  The skin may seem oily or dry, but moisturizers do not help.  In infants, seborrheic dermatitis appears as scaly redness that does not seem to bother the baby. In some babies, it affects only the scalp. In others, it also affects the neck creases, armpits, groin, or behind the ears.  In adults and adolescents, seborrheic dermatitis may affect only the scalp. It may look patchy or spread out, with areas of redness and flaking. Other areas commonly affected include:  Eyebrows.  Eyelids.  Forehead.  Skin behind the ears.  Outer ears.  Chest.  Armpits.  Nose creases.  Skin creases under the breasts.  Skin between the buttocks.  Groin.  Some adults and adolescents feel itching or burning in the affected areas. DIAGNOSIS  Your caregiver can usually tell what the problem is by doing a physical exam. TREATMENT   Cortisone (steroid) ointments, creams, and lotions can help decrease inflammation.  Babies can be treated with baby oil to soften the scales, then they may be washed with baby shampoo. If this does not help, a prescription topical steroid medicine may work.  Adults can use medicated shampoos.  Your caregiver may prescribe corticosteroid cream and shampoo containing an antifungal or yeast medicine (ketoconazole). Hydrocortisone or anti-yeast cream can be rubbed directly onto seborrheic dermatitis patches. Yeast does not cause seborrheic dermatitis, but it seems to add to the problem. In infants, seborrheic dermatitis is often worst during the first year of life. It tends to disappear on its own as the  child grows. However, it may return during the teenage years. In adults and adolescents, seborrheic dermatitis tends to be a long-lasting condition that comes and goes over many years. HOME CARE INSTRUCTIONS   Use prescribed medicines as directed.  In infants, do not aggressively remove the scales or flakes on the scalp with a comb or by other means. This may lead to hair loss. SEEK MEDICAL CARE IF:   The problem does not improve from the medicated shampoos, lotions, or other medicines given by your caregiver.  You have any other questions or concerns. Document Released: 05/04/2005 Document Revised: 11/03/2011 Document Reviewed: 09/23/2009 [facility] Patient Information 2014 [place], [place].

## 2012-12-15 ENCOUNTER — Telehealth: Payer: Self-pay

## 2012-12-15 NOTE — Telephone Encounter (Signed)
Pt is wanting to know about getting a refill on ketoconazole hydrocortione cream  Best number is [phone]

## 2012-12-16 MED ORDER — KETOCONAZOLE-HYDROCORTISONE 2 & 1 % (CREAM) EX KIT: 1.0000 "application " | PACK | Freq: Two times a day (BID) | CUTANEOUS | Status: DC

## 2012-12-16 NOTE — Telephone Encounter (Signed)
Looks like the rx he was given in May had 2 refills on it, so he may have some on file at the pharmacy. If not, ok to refill x 2

## 2012-12-16 NOTE — Telephone Encounter (Signed)
Sent in

## 2013-01-23 ENCOUNTER — Other Ambulatory Visit: Payer: Self-pay | Admitting: Dermatology

## 2013-04-29 ENCOUNTER — Other Ambulatory Visit: Payer: Self-pay | Admitting: Emergency Medicine

## 2013-09-15 IMAGING — PT NM PET TUM IMG INITIAL (PI) WHOLE BODY
12 series · 25 of 25 positions shown · non-contrast
Comparison: Plain film chest 08/29/2007.

CLINICAL DATA: Initial treatment strategy for sampling of the left
knee demonstrating metastasis from colon primary.  History gastric
ulcer.  Esophageal stricture..

NUCLEAR MEDICINE WHOLE BODY PET CT
TECHNIQUE: 12.0 mCi F-18 FDG was injected intravenously via the
left AC.  Full-ring PET imaging was performed from the skull vertex
through the lower extremities 51 minutes after injection.  CT data
was obtained and used for attenuation correction and anatomic
localization only.  (This was not acquired as a diagnostic CT
examination.)
Fasting Blood Glucose:  106

[Series 1: pet ac · axial · 3.3mm · 4.69mm/px · z∈[-1056,-42]mm · 2 of 311 slices shown]
[im 1/311]
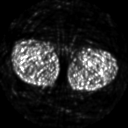
[im 311/311]
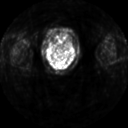

[Series 1: pet ac legs · axial · 3.3mm · 4.69mm/px · z∈[+14,+884]mm · 2 of 267 slices shown]
[im 1/267]
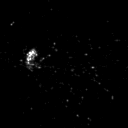
[im 267/267]
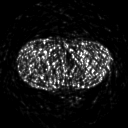

[Series 2: ct legs · axial · 3.8mm · 0.98mm/px · z∈[+14,+884]mm · 3 of 265 slices shown]
[im 1/265]
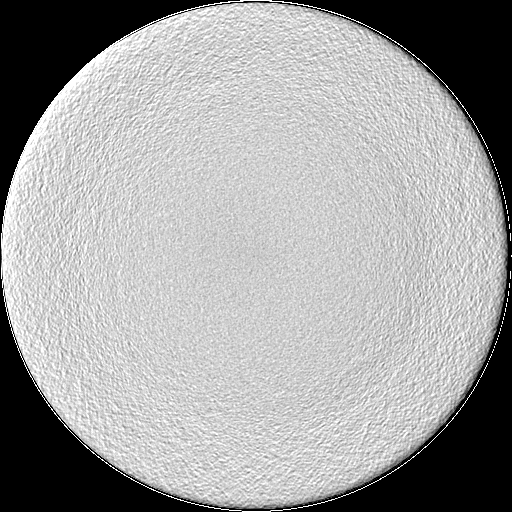
[im 133/265  soft-tissue]
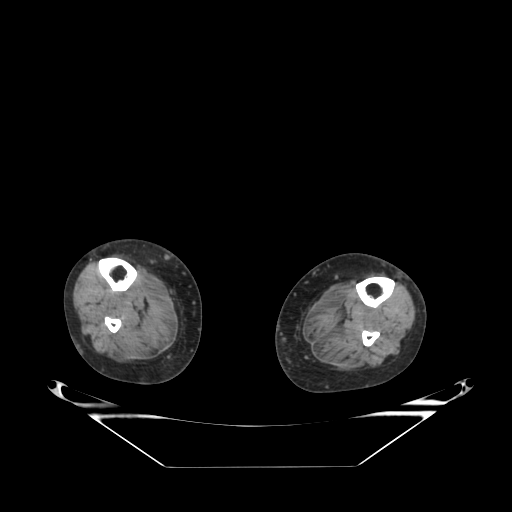
[im 265/265  soft-tissue]
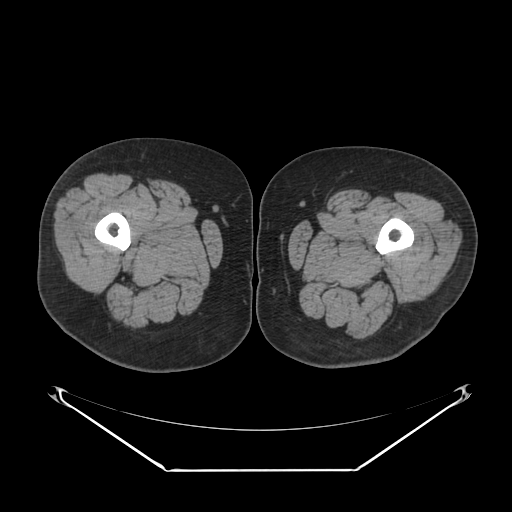

[Series 2: ct images · axial · 3.8mm · 0.98mm/px · z∈[-1056,-42]mm · 3 of 301 slices shown]
[im 1/301  soft-tissue]
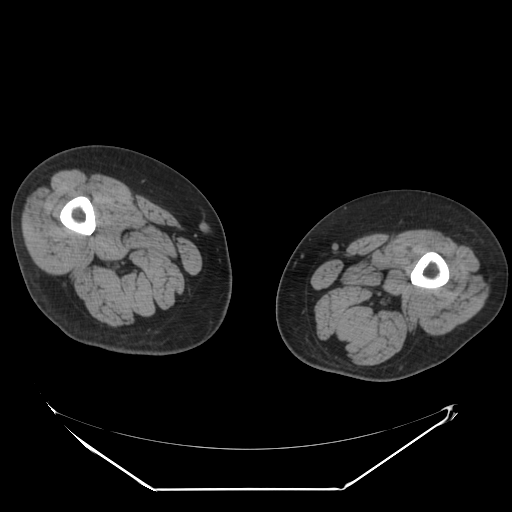
[im 151/301  soft-tissue]
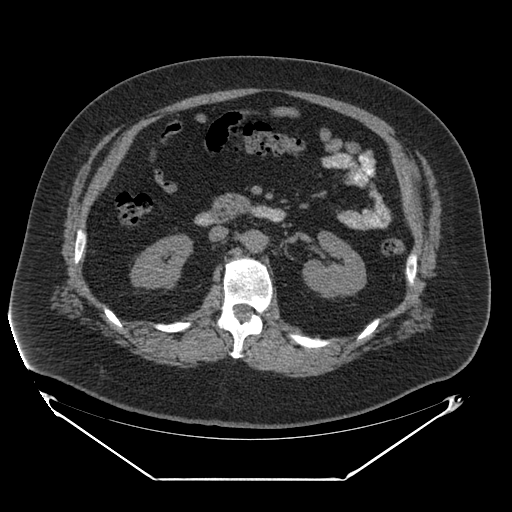
[im 301/301  soft-tissue]
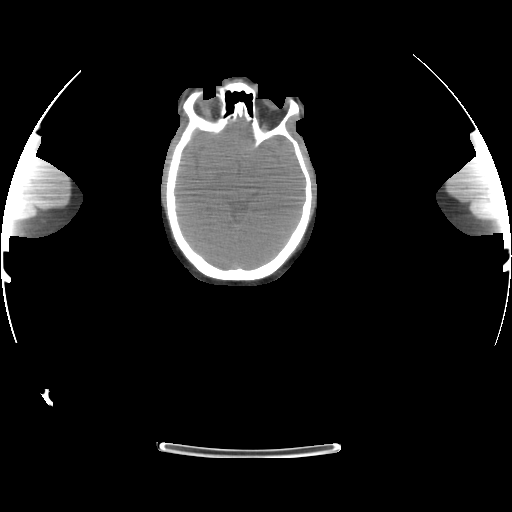

[Series 2: pet nac · axial · 3.3mm · 4.69mm/px · z∈[-1056,-42]mm · 2 of 311 slices shown]
[im 1/311]
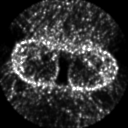
[im 311/311]
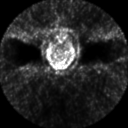

[Series 2: pet nac legs · axial · 3.3mm · 4.69mm/px · z∈[+14,+884]mm · 3 of 267 slices shown]
[im 1/267]
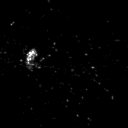
[im 134/267]
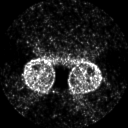
[im 267/267]
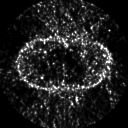

[Series 123: mip · coronal · 3.3mm · 4.69mm/px · 1 of 30 slices shown (1 of 2)]
[im 1/30]
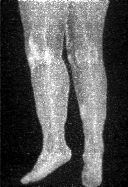

[Series 123: mip · coronal · 3.3mm · 4.69mm/px · 1 of 30 slices shown (2 of 2)]
[im 1/30]
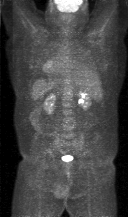

[Series 151: reformatted · axial · 3.3mm · 3.91mm/px · z∈[-1056,-42]mm · 3 of 308 slices shown (1 of 4)]
[im 1/308]
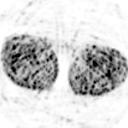
[im 154/308]
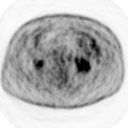
[im 308/308]
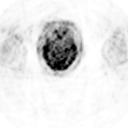

[Series 153: reformatted · coronal · 4.7mm · 8.14mm/px · 1 of 84 slices shown (2 of 4)]
[im 1/84]
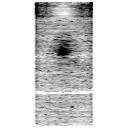

[Series 156: reformatted · axial · 3.3mm · 3.91mm/px · z∈[+14,+884]mm · 3 of 265 slices shown (3 of 4)]
[im 1/265]
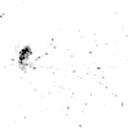
[im 133/265]
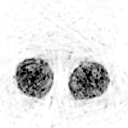
[im 265/265]
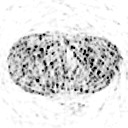

[Series 158: reformatted · coronal · 4.7mm · 6.98mm/px · 1 of 62 slices shown (4 of 4)]
[im 1/62]
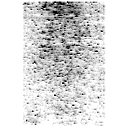

[25 of 25 positions shown; findings below may reference images not displayed]

FINDINGS: PET images demonstrate mild degradation, secondary to
patient body habitus.  Given this factor, no abnormal activity
within the neck, chest, abdomen, or pelvis.

There is mild low level hypermetabolism about the suprapatellar
region of the left knee.  This corresponds to overlying skin
thickening and subcutaneous edema.  Likely postoperative.  CT
performed for attenuation correction demonstrates small, likely
reactive jugular chain nodes bilaterally.

Borderline cardiomegaly.

Mild to moderate hepatic steatosis.  Cholecystectomy.  No evidence
of colonic primary mass.  Right ACL reconstruction.
IMPRESSION: 1.  Mildly degraded exam secondary patient body habitus.
2.  No evidence of primary malignancy or metastatic disease.
3.  Mild, likely postoperative hypermetabolism about the left knee.

## 2014-01-11 ENCOUNTER — Ambulatory Visit (INDEPENDENT_AMBULATORY_CARE_PROVIDER_SITE_OTHER): Payer: 59 | Admitting: Family Medicine

## 2014-01-11 VITALS — HR 114 | Temp 98.2°F | Resp 16 | Ht 71.0 in | Wt 348.0 lb

## 2014-01-11 DIAGNOSIS — F3289 Other specified depressive episodes: Secondary | ICD-10-CM

## 2014-01-11 DIAGNOSIS — F32A Depression, unspecified: Secondary | ICD-10-CM

## 2014-01-11 DIAGNOSIS — I10 Essential (primary) hypertension: Secondary | ICD-10-CM

## 2014-01-11 DIAGNOSIS — F329 Major depressive disorder, single episode, unspecified: Secondary | ICD-10-CM

## 2014-01-11 MED ORDER — METOPROLOL SUCCINATE ER 50 MG PO TB24: 50.0000 mg | ORAL_TABLET | Freq: Every day | ORAL | Status: DC

## 2014-01-11 MED ORDER — SERTRALINE HCL 100 MG PO TABS: 100.0000 mg | ORAL_TABLET | Freq: Every day | ORAL | Status: DC

## 2014-01-11 NOTE — Progress Notes (Signed)
This chart was scribed for Melvin Haber, MD by Melvin Ford, ED Scribe. The patient was seen in room 4. Patient's care was started at 1:22 PM.  Patient ID: Melvin Ford MRN: [id number], DOB: September 10, 1965, 48 y.o. Date of Encounter: 01/11/2014, 1:22 PM  Primary Physician: No PCP Per Patient  Chief Complaint  Patient presents with  . Depression    x 2 weeks    HPI: 48 y.o. year old male with history below presents with intermittent, gradually worsening episodes of depression. Pt states that he has had depression since his teenage years. He states that he has not had an episode for 6-7 years. Pt states that episode started a few months ago, but has worsened over the past 2 weeks. He attributes depression to a recent increase in stress. Pt has tried antidepressants with temporary relief. He has tried Zoloft in the past which seemed to work short-term without side effects. He states that he usually needs high doses of medication; pt has taken approximately 300 mg of Zoloft in the past. He has also tried Lexapro with very mild relief. Pt has also tried Prozac which kept him awake. He denies SI. Pt states that he loves his job but does not like the face-to-face communication with people and answering repetitive questions. He also reports associated difficulty sleeping.  Pt also expresses concerns of elevated BP. He states that he has checked his BP at home with readings that range from 140-160 and rarely 101. BP during examination: 155/92.   Pt takes Naproxen x2 daily for back pain and Benadryl nightly to sleep. Pt wears a digital cpap at night; around setting 9.   Pt is married without children. He owns a [employer].  Past Medical History  Diagnosis Date  . Acute meniscal tear of knee left  . OSA on CPAP   . History of gastric ulcer   . History of esophageal stricture   . DDD (degenerative disc disease), thoracolumbar   . Hypertension NO MEDS  . Sleep apnea   . Gout      Home  Meds: Prior to Admission medications   Medication Sig Start Date End Date Taking? Authorizing Provider  hydrocortisone cream 1 % Apply 1 application topically 2 (two) times daily.   Yes Historical Provider, MD  Diclofenac Sodium CR 100 MG 24 hr tablet Take 1 tablet (100 mg total) by mouth daily. 10/14/12   Roselee Culver, MD  HYDROcodone-acetaminophen (NORCO) 5-325 MG per tablet Take 1-2 tablets by mouth every 4 (four) hours as needed for pain. 10/14/12   Roselee Culver, MD  ketoconazole (NIZORAL) 2 % shampoo APPLY TOPICALLY 2 (TWO) TIMES A WEEK. 04/29/13   Collene Leyden, PA-C  traMADol (ULTRAM) 50 MG tablet Take 50 mg by mouth Every 6 hours as needed. 08/07/11   Historical Provider, MD    Allergies:  Allergies  Allergen Reactions  . Allopurinol Other (See Comments)    Long-term effect  Muscle atrophy    History   Social History  . Marital Status: Married    Spouse Name: N/A    Number of Children: N/A  . Years of Education: N/A   Occupational History  . Building services engineer    Social History Main Topics  . Smoking status: Never Smoker   . Smokeless tobacco: Never Used  . 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  Drug Use: No  . Sexual Activity: Yes    Birth Control/ Protection: None   Other Topics Concern  . Not on file   Social History Narrative  . No narrative on file     Review of Systems: Constitutional: negative for chills, fever, night sweats, weight changes, or fatigue  HEENT: negative for vision changes, hearing loss, congestion, rhinorrhea, ST, epistaxis, or sinus pressure Cardiovascular: negative for chest pain or palpitations Respiratory: negative for hemoptysis, wheezing, shortness of breath, or cough Abdominal: negative for abdominal pain, nausea, vomiting, diarrhea, or constipation Dermatological: negative for rash Neurologic: negative for headache, dizziness, or  syncope All other systems reviewed and are otherwise negative with the exception to those above and in the HPI.   Physical Exam: Pulse 114, temperature 98.2 F (36.8 C), temperature source Oral, resp. rate 16, height 5\' 11"  (1.803 m), weight 348 lb (157.852 kg), SpO2 95.00%., Body mass index is 48.56 kg/(m^2). General: Well developed, well nourished, in no acute distress. Head: Normocephalic, atraumatic, eyes without discharge, sclera non-icteric, nares are without discharge. Bilateral auditory canals clear, TM's are without perforation, pearly grey and translucent with reflective cone of light bilaterally. Oral cavity moist, posterior pharynx without exudate, erythema, peritonsillar abscess, or post nasal drip.  Neck: Supple. No thyromegaly. Full ROM. No lymphadenopathy. Lungs: Clear bilaterally to auscultation without wheezes, rales, or rhonchi. Breathing is unlabored. Heart: RRR with S1 S2. No murmurs, rubs, or gallops appreciated. Abdomen: Soft, non-tender, non-distended with normoactive bowel sounds. No hepatomegaly. No rebound/guarding. No obvious abdominal masses. Msk:  Strength and tone normal for age. Extremities/Skin: Warm and dry. No clubbing or cyanosis. No edema. No rashes or suspicious lesions. Neuro: Alert and oriented X 3. Moves all extremities spontaneously. Gait is normal. CNII-XII grossly in tact. Psych:  Responds to questions appropriately with a normal affect.     ASSESSMENT AND PLAN:  48 y.o. year old male with   Depression - Plan: sertraline (ZOLOFT) 100 MG tablet  Essential hypertension - Plan: metoprolol succinate (TOPROL-XL) 50 MG 24 hr tablet   Signed, Melvin Haber, MD 01/11/2014 1:22 PM

## 2014-07-24 ENCOUNTER — Ambulatory Visit (INDEPENDENT_AMBULATORY_CARE_PROVIDER_SITE_OTHER): Payer: 59 | Admitting: Family Medicine

## 2014-07-24 VITALS — BP 120/94 | HR 110 | Temp 98.3°F | Resp 12 | Ht 71.5 in | Wt 329.5 lb

## 2014-07-24 DIAGNOSIS — R358 Other polyuria: Secondary | ICD-10-CM | POA: Diagnosis not present

## 2014-07-24 DIAGNOSIS — R631 Polydipsia: Secondary | ICD-10-CM

## 2014-07-24 DIAGNOSIS — R3589 Other polyuria: Secondary | ICD-10-CM

## 2014-07-24 DIAGNOSIS — E119 Type 2 diabetes mellitus without complications: Secondary | ICD-10-CM | POA: Diagnosis not present

## 2014-07-24 DIAGNOSIS — E860 Dehydration: Secondary | ICD-10-CM | POA: Diagnosis not present

## 2014-07-24 LAB — POCT CBC
Granulocyte percent: 71.6 %G (ref 37–80)
HCT, POC: 52.9 % (ref 43.5–53.7)
Hemoglobin: 17.3 g/dL (ref 14.1–18.1)
Lymph, poc: 3.2 (ref 0.6–3.4)
MCH, POC: 28.6 pg (ref 27–31.2)
MCHC: 32.7 g/dL (ref 31.8–35.4)
MCV: 87.2 fL (ref 80–97)
MID (cbc): 0.8 (ref 0–0.9)
MPV: 8.6 fL (ref 0–99.8)
POC Granulocyte: 10.2 — AB (ref 2–6.9)
POC LYMPH PERCENT: 22.7 %L (ref 10–50)
POC MID %: 5.7 %M (ref 0–12)
Platelet Count, POC: 283 10*3/uL (ref 142–424)
RBC: 6.07 M/uL (ref 4.69–6.13)
RDW, POC: 14.3 %
WBC: 14.3 10*3/uL — AB (ref 4.6–10.2)

## 2014-07-24 LAB — POCT URINALYSIS DIPSTICK
Bilirubin, UA: NEGATIVE
Blood, UA: NEGATIVE
Glucose, UA: 500
Ketones, UA: NEGATIVE
Leukocytes, UA: NEGATIVE
Nitrite, UA: NEGATIVE
Spec Grav, UA: 1.015
Urobilinogen, UA: 0.2
pH, UA: 5

## 2014-07-24 LAB — POCT GLYCOSYLATED HEMOGLOBIN (HGB A1C): Hemoglobin A1C: 8.9

## 2014-07-24 LAB — GLUCOSE, POCT (MANUAL RESULT ENTRY): POC Glucose: 391 mg/dl — AB (ref 70–99)

## 2014-07-24 MED ORDER — GLIPIZIDE 5 MG PO TABS: 5.0000 mg | ORAL_TABLET | Freq: Every day | ORAL | Status: DC

## 2014-07-24 MED ORDER — METFORMIN HCL 500 MG PO TABS
500.0000 mg | ORAL_TABLET | Freq: Two times a day (BID) | ORAL | Status: DC
Start: 2014-07-24 — End: 2014-11-26

## 2014-07-24 NOTE — Progress Notes (Signed)
This chart was scribed for Dr. [name], MD by Erling Conte, Medical Scribe. This patient was seen in Room 10 and the patient's care was started at 7:11 PM.   Patient ID: Melvin Ford MRN: [id number], DOB: 04-19-1966, 49 y.o. Date of Encounter: 07/24/2014, 7:11 PM  Primary Physician: No PCP Per Patient  Chief Complaint:  Chief Complaint  Patient presents with   Fatigue    fatigue has been going on for a while but worse in the last 3 weeks     HPI: 49 y.o. year old male with history below presents with gradually worsening fatigue for 3 weeks. He is having associated nausea, polydipsia, vision changes, and urinary frequency, specifically at night.  Pt is having concerns that he may have diabetes. He states that diabetes runs in his family. His grandfather and mother both developed diabetes in their 32s. Pt has a h/o depression and sleep apnea. He denies any other symptoms at this time.   Past Medical History  Diagnosis Date   Acute meniscal tear of knee left   OSA on CPAP    History of gastric ulcer    History of esophageal stricture    DDD (degenerative disc disease), thoracolumbar    Hypertension NO MEDS   Sleep apnea    Gout      Home Meds: Prior to Admission medications   Medication Sig Start Date End Date Taking? Authorizing Provider  diphenhydrAMINE (SOMINEX) 25 MG tablet Take 25 mg by mouth at bedtime as needed for sleep.   Yes Historical Provider, MD  hydrocortisone cream 1 % Apply 1 application topically 2 (two) times daily.   Yes Historical Provider, MD  naproxen sodium (ANAPROX) 220 MG tablet Take 220 mg by mouth 2 (two) times daily with a meal.   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 negative for chills, fever, night sweats, or weight changes.  HEENT: positive for vision changes. negative for hearing loss, congestion, rhinorrhea, ST, epistaxis, or sinus pressure Cardiovascular: negative for chest pain or palpitations Respiratory: negative for hemoptysis, wheezing, shortness of breath, or cough Abdominal: positive for abdominal pain, nausea, and vomiting. negative for diarrhea and constipation Dermatological: negative for rash Neurologic: negative for headache, dizziness, or syncope All other systems reviewed and are otherwise negative with the exception to those above and in the HPI.   Physical Exam: Blood pressure 120/94, pulse 110, temperature 98.3 F (36.8 C), temperature source Oral, resp. rate 12, height 5' 11.5" (1.816 m), weight 329 lb 8 oz (149.46 kg), SpO2 96 %., Body mass index is 45.32 kg/(m^2).  General: Well  developed, well nourished, in no acute distress. Head: Normocephalic, atraumatic, eyes without discharge, sclera non-icteric, nares are without discharge. Bilateral auditory canals clear, TM's are without perforation, pearly grey and translucent with reflective cone of light bilaterally. Oral cavity moist, posterior pharynx without exudate, erythema, peritonsillar abscess, or post nasal drip.  Neck: Supple. No thyromegaly. Full ROM. No lymphadenopathy. Lungs: Clear bilaterally to auscultation without wheezes, rales, or rhonchi. Breathing is unlabored. Heart: RRR with S1 S2. No murmurs, rubs,  or gallops appreciated. Abdomen: Soft, non-tender, non-distended with normoactive bowel sounds. No hepatomegaly. No rebound/guarding. No obvious abdominal masses. Msk:  Strength and tone normal for age. Extremities/Skin: Warm and dry. No clubbing or cyanosis. No edema. No rashes or suspicious lesions. Neuro: Alert and oriented X 3. Moves all extremities spontaneously. Gait is normal. CNII-XII grossly in tact. Psych:  Responds to questions appropriately with a normal affect.   Labs:  Results for orders placed or performed in visit on 07/24/14  POCT CBC  Result Value Ref Range   WBC 14.3 (A) 4.6 - 10.2 K/uL   Lymph, poc 3.2 0.6 - 3.4   POC LYMPH PERCENT 22.7 10 - 50 %L   MID (cbc) 0.8 0 - 0.9   POC MID % 5.7 0 - 12 %M   POC Granulocyte 10.2 (A) 2 - 6.9   Granulocyte percent 71.6 37 - 80 %G   RBC 6.07 4.69 - 6.13 M/uL   Hemoglobin 17.3 14.1 - 18.1 g/dL   HCT, POC 52.9 43.5 - 53.7 %   MCV 87.2 80 - 97 fL   MCH, POC 28.6 27 - 31.2 pg   MCHC 32.7 31.8 - 35.4 g/dL   RDW, POC 14.3 %   Platelet Count, POC 283 142 - 424 K/uL   MPV 8.6 0 - 99.8 fL  POCT glucose (manual entry)  Result Value Ref Range   POC Glucose 391 (A) 70 - 99 mg/dl  POCT glycosylated hemoglobin (Hb A1C)  Result Value Ref Range   Hemoglobin A1C 8.9     ASSESSMENT AND PLAN:  49 y.o. year old male with   1. Dehydration   2. Polyuria   3. Polydipsia    Meds ordered this encounter  Medications   naproxen sodium (ANAPROX) 220 MG tablet    Sig: Take 220 mg by mouth 2 (two) times daily with a meal.   diphenhydrAMINE (SOMINEX) 25 MG tablet    Sig: Take 25 mg by mouth at bedtime as needed for sleep.   This chart was scribed in my presence and reviewed by me personally.    ICD-9-CM ICD-10-CM   1. Dehydration 276.51 E86.0 POCT urinalysis dipstick     POCT CBC     Comprehensive metabolic panel     Lipid panel     POCT glucose (manual entry)     POCT glycosylated hemoglobin (Hb A1C)     metFORMIN (GLUCOPHAGE)  500 MG tablet     glipiZIDE (GLUCOTROL) 5 MG tablet  2. Polyuria 788.42 R35.8 POCT urinalysis dipstick     POCT CBC     Comprehensive metabolic panel     Lipid panel     POCT glucose (manual entry)     POCT glycosylated hemoglobin (Hb A1C)     metFORMIN (GLUCOPHAGE) 500 MG tablet     glipiZIDE (GLUCOTROL) 5 MG tablet  3. Polydipsia 783.5 R63.1 POCT urinalysis dipstick     POCT CBC     Comprehensive metabolic panel     Lipid panel  POCT glucose (manual entry)     POCT glycosylated hemoglobin (Hb A1C)     metFORMIN (GLUCOPHAGE) 500 MG tablet     glipiZIDE (GLUCOTROL) 5 MG tablet  4. 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

## 2014-07-24 NOTE — Patient Instructions (Addendum)
Please return in 3 days for follow up  Type 2 Diabetes Mellitus Type 2 diabetes mellitus, often simply referred to as type 2 diabetes, is a long-lasting (chronic) disease. In type 2 diabetes, the pancreas does not make enough insulin (a hormone), the cells are less responsive to the insulin that is made (insulin resistance), or both. Normally, insulin moves sugars from food into the tissue cells. The tissue cells use the sugars for energy. The lack of insulin or the lack of normal response to insulin causes excess sugars to build up in the blood instead of going into the tissue cells. As a result, high blood sugar (hyperglycemia) develops. The effect of high sugar (glucose) levels can cause many complications. Type 2 diabetes was also previously called adult-onset diabetes, but it can occur at any age.  RISK FACTORS  A person is predisposed to developing type 2 diabetes if someone in the family has the disease and also has one or more of the following primary risk factors:  Overweight.  An inactive lifestyle.  A history of consistently eating high-calorie foods. Maintaining a normal weight and regular physical activity can reduce the chance of developing type 2 diabetes. SYMPTOMS  A person with type 2 diabetes may not show symptoms initially. The symptoms of type 2 diabetes appear slowly. The symptoms include:  Increased thirst (polydipsia).  Increased urination (polyuria).  Increased urination during the night (nocturia).  Weight loss. This weight loss may be rapid.  Frequent, recurring infections.  Tiredness (fatigue).  Weakness.  Vision changes, such as blurred vision.  Fruity smell to your breath.  Abdominal pain.  Nausea or vomiting.  Cuts or bruises which are slow to heal.  Tingling or numbness in the hands or feet. DIAGNOSIS Type 2 diabetes is frequently not diagnosed until complications of diabetes are present. Type 2 diabetes is diagnosed when symptoms or  complications are present and when blood glucose levels are increased. Your blood glucose level may be checked by one or more of the following blood tests:  A fasting blood glucose test. You will not be allowed to eat for at least 8 hours before a blood sample is taken.  A random blood glucose test. Your blood glucose is checked at any time of the day regardless of when you ate.  A hemoglobin A1c blood glucose test. A hemoglobin A1c test provides information about blood glucose control over the previous 3 months.  An oral glucose tolerance test (OGTT). Your blood glucose is measured after you have not eaten (fasted) for 2 hours and then after you drink a glucose-containing beverage. TREATMENT   You may need to take insulin or diabetes medicine daily to keep blood glucose levels in the desired range.  If you use insulin, you may need to adjust the dosage depending on the carbohydrates that you eat with each meal or snack. The treatment goal is to maintain the before meal blood sugar (preprandial glucose) level at 70-130 mg/dL. HOME CARE INSTRUCTIONS   Have your hemoglobin A1c level checked twice a year.  Perform daily blood glucose monitoring as directed by your health care provider.  Monitor urine ketones when you are ill and as directed by your health care provider.  Take your diabetes medicine or insulin as directed by your health care provider to maintain your blood glucose levels in the desired range.  Never run out of diabetes medicine or insulin. It is needed every day.  If you are using insulin, you may need to adjust the  amount of insulin given based on your intake of carbohydrates. Carbohydrates can raise blood glucose levels but need to be included in your diet. Carbohydrates provide vitamins, minerals, and fiber which are an essential part of a healthy diet. Carbohydrates are found in fruits, vegetables, whole grains, dairy products, legumes, and foods containing added  sugars.  Eat healthy foods. You should make an appointment to see a registered dietitian to help you create an eating plan that is right for you.  Lose weight if you are overweight.  Carry a medical alert card or wear your medical alert jewelry.  Carry a 15-gram carbohydrate snack with you at all times to treat low blood glucose (hypoglycemia). Some examples of 15-gram carbohydrate snacks include:  Glucose tablets, 3 or 4.  Glucose gel, 15-gram tube.  Raisins, 2 tablespoons (24 grams).  Jelly beans, 6.  Animal crackers, 8.  Regular pop, 4 ounces (120 mL).  Gummy treats, 9.  Recognize hypoglycemia. Hypoglycemia occurs with blood glucose levels of 70 mg/dL and below. The risk for hypoglycemia increases when fasting or skipping meals, during or after intense exercise, and during sleep. Hypoglycemia symptoms can include:  Tremors or shakes.  Decreased ability to concentrate.  Sweating.  Increased heart rate.  Headache.  Dry mouth.  Hunger.  Irritability.  Anxiety.  Restless sleep.  Altered speech or coordination.  Confusion.  Treat hypoglycemia promptly. If you are alert and able to safely swallow, follow the 15:15 rule:  Take 15-20 grams of rapid-acting glucose or carbohydrate. Rapid-acting options include glucose gel, glucose tablets, or 4 ounces (120 mL) of fruit juice, regular soda, or low-fat milk.  Check your blood glucose level 15 minutes after taking the glucose.  Take 15-20 grams more of glucose if the repeat blood glucose level is still 70 mg/dL or below.  Eat a meal or snack within 1 hour once blood glucose levels return to normal.  Be alert to feeling very thirsty and urinating more frequently than usual, which are early signs of hyperglycemia. An early awareness of hyperglycemia allows for prompt treatment. Treat hyperglycemia as directed by your health care provider.  Engage in at least 150 minutes of moderate-intensity physical activity a  week, spread over at least 3 days of the week or as directed by your health care provider. In addition, you should engage in resistance exercise at least 2 times a week or as directed by your health care provider. Try to spend no more than 90 minutes at one time inactive.  Adjust your medicine and food intake as needed if you start a new exercise or sport.  Follow your sick-day plan anytime you are unable to eat or drink as usual.  Do not use any tobacco products including cigarettes, chewing tobacco, or electronic cigarettes. If you need help quitting, ask your health care provider.  Limit alcohol intake to no more than 1 drink per day for nonpregnant women and 2 drinks per day for men. You should drink alcohol only when you are also eating food. Talk with your health care provider whether alcohol is safe for you. Tell your health care provider if you drink alcohol several times a week.  Keep all follow-up visits as directed by your health care provider. This is important.  Schedule an eye exam soon after the diagnosis of type 2 diabetes and then annually.  Perform daily skin and foot care. Examine your skin and feet daily for cuts, bruises, redness, nail problems, bleeding, blisters, or sores. A foot exam by  a health care provider should be done annually.  Brush your teeth and gums at least twice a day and floss at least once a day. Follow up with your dentist regularly.  Share your diabetes management plan with your workplace or school.  Stay up-to-date with immunizations. It is recommended that people with diabetes who are over 60 years old get the pneumonia vaccine. In some cases, two separate shots may be given. Ask your health care provider if your pneumonia vaccination is up-to-date.  Learn to manage stress.  Obtain ongoing diabetes education and support as needed.  Participate in or seek rehabilitation as needed to maintain or improve independence and quality of life. Request a  physical or occupational therapy referral if you are having foot or hand numbness, or difficulties with grooming, dressing, eating, or physical activity. SEEK MEDICAL CARE IF:   You are unable to eat food or drink fluids for more than 6 hours.  You have nausea and vomiting for more than 6 hours.  Your blood glucose level is over 240 mg/dL.  There is a change in mental status.  You develop an additional serious illness.  You have diarrhea for more than 6 hours.  You have been sick or have had a fever for a couple of days and are not getting better.  You have pain during any physical activity.  SEEK IMMEDIATE MEDICAL CARE IF:  You have difficulty breathing.  You have moderate to large ketone levels. MAKE SURE YOU:  Understand these instructions.  Will watch your condition.  Will get help right away if you are not doing well or get worse. Document Released: 05/04/2005 Document Revised: 09/18/2013 Document Reviewed: 12/01/2011 [employer] Patient Information 2015 [place], [place]. This information is not intended to replace advice given to you by your health care provider. Make sure you discuss any questions you have with your health care provider.

## 2014-07-25 LAB — COMPREHENSIVE METABOLIC PANEL
ALT: 48 U/L (ref 0–53)
AST: 27 U/L (ref 0–37)
Albumin: 4.4 g/dL (ref 3.5–5.2)
Alkaline Phosphatase: 106 U/L (ref 39–117)
BUN: 15 mg/dL (ref 6–23)
CO2: 20 mEq/L (ref 19–32)
Calcium: 9.6 mg/dL (ref 8.4–10.5)
Chloride: 100 mEq/L (ref 96–112)
Creat: 0.98 mg/dL (ref 0.50–1.35)
Glucose, Bld: 373 mg/dL — ABNORMAL HIGH (ref 70–99)
Potassium: 4.3 mEq/L (ref 3.5–5.3)
Sodium: 135 mEq/L (ref 135–145)
Total Bilirubin: 0.9 mg/dL (ref 0.2–1.2)
Total Protein: 7.5 g/dL (ref 6.0–8.3)

## 2014-07-25 LAB — LIPID PANEL
Cholesterol: 202 mg/dL — ABNORMAL HIGH (ref 0–200)
HDL: 27 mg/dL — ABNORMAL LOW (ref >=40)
LDL Cholesterol: 117 mg/dL — ABNORMAL HIGH (ref 0–99)
Total CHOL/HDL Ratio: 7.5 Ratio
Triglycerides: 292 mg/dL — ABNORMAL HIGH (ref <150)
VLDL: 58 mg/dL — ABNORMAL HIGH (ref 0–40)

## 2014-07-27 ENCOUNTER — Ambulatory Visit (INDEPENDENT_AMBULATORY_CARE_PROVIDER_SITE_OTHER): Payer: 59 | Admitting: Family Medicine

## 2014-07-27 VITALS — BP 132/80 | HR 110 | Temp 98.7°F | Resp 17 | Ht 71.5 in | Wt 335.0 lb

## 2014-07-27 DIAGNOSIS — I10 Essential (primary) hypertension: Secondary | ICD-10-CM

## 2014-07-27 DIAGNOSIS — E1165 Type 2 diabetes mellitus with hyperglycemia: Secondary | ICD-10-CM | POA: Diagnosis not present

## 2014-07-27 DIAGNOSIS — L719 Rosacea, unspecified: Secondary | ICD-10-CM

## 2014-07-27 DIAGNOSIS — IMO0002 Reserved for concepts with insufficient information to code with codable children: Secondary | ICD-10-CM

## 2014-07-27 LAB — GLUCOSE, POCT (MANUAL RESULT ENTRY): POC Glucose: 221 mg/dl — AB (ref 70–99)

## 2014-07-27 MED ORDER — METRONIDAZOLE 1 % EX GEL: Freq: Every day | CUTANEOUS | Status: DC

## 2014-07-27 MED ORDER — ONETOUCH ULTRASOFT LANCETS MISC: Status: DC

## 2014-07-27 MED ORDER — LISINOPRIL 20 MG PO TABS: 20.0000 mg | ORAL_TABLET | Freq: Every day | ORAL | Status: DC

## 2014-07-27 NOTE — Patient Instructions (Addendum)
Recheck early April    Rosacea Rosacea is a long-term (chronic) condition that affects the skin of the face (cheeks, nose, brow, and chin) and sometimes the eyes. Rosacea causes the blood vessels near the surface of the skin to enlarge, resulting in redness. This condition usually begins after age 48. It occurs most often in light-skinned women. Without treatment, rosacea tends to get worse over time. There is no cure for rosacea, but treatment can help control your symptoms. CAUSES  The cause is unknown. It is thought that some people may inherit a tendency to develop rosacea. Certain triggers can make your rosacea worse, including:  Hot baths.  Exercise.  Sunlight.  Very hot or cold temperatures.  Hot or spicy foods and drinks.  Drinking alcohol.  Stress.  Taking blood pressure medicine.  Long-term use of topical steroids on the face. SYMPTOMS   Redness of the face.  Red bumps or pimples on the face.  Red, enlarged nose (rhinophyma).  Blushing easily.  Red lines on the skin.  Irritated or burning feeling in the eyes.  Swollen eyelids. DIAGNOSIS  Your caregiver can usually tell what is wrong by asking about your symptoms and performing a physical exam. TREATMENT  Avoiding triggers is an important part of treatment. You will also need to see a skin specialist (dermatologist) who can develop a treatment plan for you. The goals of treatment are to control your condition and to improve the appearance of your skin. It may take several weeks or months of treatment before you notice an improvement in your skin. Even after your skin improves, you will likely need to continue treatment to prevent your rosacea from coming back. Treatment methods may include:  Using sunscreen or sunblock daily to protect the skin.  Antibiotic medicine, such as metronidazole, applied directly to the skin.  Antibiotics taken by mouth. This is usually prescribed if you have eye problems from your  rosacea.  Laser surgery to improve the appearance of the skin. This surgery can reduce the appearance of red lines on the skin and can remove excess tissue from the nose to reduce its size. HOME CARE INSTRUCTIONS  Avoid things that seem to trigger your flare-ups.  If you are given antibiotics, take them as directed. Finish them even if you start to feel better.  Use a gentle facial cleanser that does not contain alcohol.  You may use a mild facial moisturizer.  Use a sunscreen or sunblock with SPF 30 or greater.  Wear a green-tinted foundation powder to conceal redness, if needed. Choose cosmetics that are noncomedogenic. This means they do not block your pores.  If your eyelids are affected, apply warm compresses to the eyelids. Do this up to 4 times a day or as directed by your caregiver. SEEK MEDICAL CARE IF:  Your skin problems get worse.  You feel depressed.  You lose your appetite.  You have trouble concentrating.  You have problems with your eyes, such as redness or itching. MAKE SURE YOU:  Understand these instructions.  Will watch your condition.  Will get help right away if you are not doing well or get worse. Document Released: 06/11/2004 Document Revised: 11/03/2011 Document Reviewed: 04/14/2011 [facility] Patient Information 2015 [place], [place]. This information is not intended to replace advice given to you by your health care provider. Make sure you discuss any questions you have with your health care provider.  Patient notified and voiced understanding. Notes Recorded by [name], MD on 07/25/2014 at 7:12 PM  Patient has abnormal lab values. Mild cholesterol elevation. Will discuss with him on Friday     Ref Range 3d ago    Cholesterol 0 - 200 mg/dL 202 (H)   Comments: ATP III Classification:     < 200    mg/dL    Desirable    200 - 239   mg/dL    Borderline High    >= 240    mg/dL    High       Triglycerides  <150 mg/dL 292 (H)   HDL >=40 mg/dL 27 (L)   Comments: ** Please note change in reference range(s). **   Total CHOL/HDL Ratio Ratio 7.5   VLDL 0 - 40 mg/dL 58 (H)   LDL Cholesterol 0 - 99 mg/dL 117 (H)   Comments:   Total Cholesterol/HDL Ratio:CHD Risk             Coronary Heart Disease Risk Table                     Men    Women      1/2 Average Risk       3.4    3.3        Average Risk       5.0    4.4       2X Average Risk       9.6    7.1       3X Average Risk       23.4    11.0  Use the calculated Patient Ratio above and the CHD Risk table  to determine the patient's CHD Risk.  ATP III Classification (LDL):     < 100    mg/dL     Optimal    100 - 129   mg/dL     Near or Above Optimal    130 - 159   mg/dL     Borderline High    160 - 189   mg/dL     High     > 190    mg/dL     Very High       Resulting Agency [facility]     Narrative     Performed at: [address], [address]        [place], [place] [address]       Specimen Collected: 07/24/14 7:19 PM Last Resulted: 07/25/14 3:51 PM                      Other Results from 07/24/2014         POCT urinalysis dipstick  Status: Finalresult Visible to patient:  Not Released Nextappt: None Dx:  Dehydration; Polydipsia; Polyuria         3d ago    Color, UA yellow   Clarity, UA clear   Glucose, UA 500   Bilirubin, UA neg   Ketones, UA neg   Spec Grav, UA 1.015   Blood, UA neg   pH, UA 5.0   Protein, UA trace   Urobilinogen, UA 0.2   Nitrite, UA neg   Leukocytes, UA Negative   Resulting Agency [facility]      Specimen Collected: 07/24/14 7:47 PM Last Resulted: 07/24/14 7:47 PM                    POCT glucose (manual entry)  Status: Finalresult Visible to  patient:  Not Released Nextappt: None Dx:  Dehydration; Polydipsia; Polyuria            Newer results are available. Click to view them now.        Ref Range 3d ago    POC Glucose 70 - 99 mg/dl 391 (A)   Resulting Agency [facility]       Specimen Collected: 07/24/14 7

## 2014-07-27 NOTE — Progress Notes (Signed)
° °  Subjective:    Patient ID: Melvin Ford, male    DOB: 14-Jun-1965, 49 y.o.   MRN: [id number] This chart was scribed for [name], MD, by Stephania Fragmin, ED Scribe. This patient was seen in room 3 and the patient's care was started at 3:34 PM.   HPI Chief Complaint  Patient presents with   Follow-up   Diabetes    headache from glipizide     HPI Comments: Melvin Ford is a 49 y.o. male who presents to the [facility] for a follow-up for DM type 2, after being seen here by me 3 days ago. He states his fatigue and polydipsia have improved somewhat since. Patient does however report a headache that would consistently onset 2-3 hours after taking Glipizide. Nothing made it better. He also reports having stomach cramps for the first 2 days he was taking Glipizide, but those resolved on their own.   Patient notes a rash on his right sided face. He states he had a similar rash in the past, which was successfully treated with what he thinks may have been a prescription antifungal cream.   He reports being interested in attending education classes on DM at the [facility] or at [facility]. He hopes to start to work with a dietitian to change his diet as well. Patient states he has previously had success with the Atkins' diet in the past, which forced him to eat more vegetables.  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  Review of Systems  Constitutional: Positive for fatigue.  Endocrine: Positive for polydipsia.  Skin: Positive for rash.       Objective:   Physical Exam  Constitutional: He is oriented to person, place, and time. He appears well-developed and well-nourished. No distress.  HENT:  Head: Normocephalic and atraumatic.  Eyes: Conjunctivae and EOM are normal.  Neck: Neck supple. No tracheal deviation present.  Cardiovascular: Normal rate.   Pulmonary/Chest: Effort normal. No respiratory distress.  Musculoskeletal: Normal range of motion.  Neurological:  He is alert and oriented to person, place, and time.  Skin: Skin is warm and dry.  Psychiatric: He has a normal mood and affect. His behavior is normal.  Nursing note and vitals reviewed.     Assessment & Plan:   This chart was scribed in my presence and reviewed by me personally.    ICD-9-CM ICD-10-CM   1. Diabetes type 2, uncontrolled 250.02 E11.65 POCT glucose (manual entry)     lisinopril (PRINIVIL,ZESTRIL) 20 MG tablet     Lancets (ONETOUCH ULTRASOFT) lancets     POCT Glucose (Device for Home Use)     Amb Referral to Nutrition and Diabetic E  2. Rosacea 695.3 L71.9 metroNIDAZOLE (METROGEL) 1 % gel  3. Essential hypertension 401.9 I10 lisinopril (PRINIVIL,ZESTRIL) 20 MG tablet   Discussed diet, importance of weight loss and exercise Recheck one month Signed, [name], MD

## 2014-07-28 ENCOUNTER — Telehealth: Payer: Self-pay

## 2014-07-28 DIAGNOSIS — IMO0002 Reserved for concepts with insufficient information to code with codable children: Secondary | ICD-10-CM

## 2014-07-28 DIAGNOSIS — E1165 Type 2 diabetes mellitus with hyperglycemia: Secondary | ICD-10-CM

## 2014-07-28 MED ORDER — ONETOUCH ULTRA SYSTEM W/DEVICE KIT: 1.0000 | PACK | Freq: Once | Status: DC

## 2014-07-28 NOTE — Addendum Note (Signed)
Addended by: Orion Crook on: 07/28/2014 10:39 AM   Modules accepted: Orders

## 2014-07-28 NOTE — Telephone Encounter (Signed)
Patient requesting one touch ultra test strips to be called in to [facility] on [address]. Per patient the kit he received did not include the test strips. Patients call back number is [phone]

## 2014-07-29 MED ORDER — GNP LANCETS MISC: 1.0000 | Freq: Every day | Status: DC

## 2014-07-29 MED ORDER — GLUCOSE BLOOD VI STRP: ORAL_STRIP | Status: DC

## 2014-07-29 NOTE — Telephone Encounter (Signed)
Patient called regarding testing supplies. He did not receive all the supplies needed. Test strips and lancets sent to pharmacy

## 2014-08-23 ENCOUNTER — Ambulatory Visit (INDEPENDENT_AMBULATORY_CARE_PROVIDER_SITE_OTHER): Payer: 59 | Admitting: Family Medicine

## 2014-08-23 ENCOUNTER — Encounter: Payer: Self-pay | Admitting: Family Medicine

## 2014-08-23 VITALS — BP 130/87 | HR 106 | Temp 98.4°F | Resp 16 | Ht 71.5 in | Wt 324.0 lb

## 2014-08-23 DIAGNOSIS — IMO0002 Reserved for concepts with insufficient information to code with codable children: Secondary | ICD-10-CM

## 2014-08-23 DIAGNOSIS — Z23 Encounter for immunization: Secondary | ICD-10-CM

## 2014-08-23 DIAGNOSIS — Z1321 Encounter for screening for nutritional disorder: Secondary | ICD-10-CM

## 2014-08-23 DIAGNOSIS — E1165 Type 2 diabetes mellitus with hyperglycemia: Secondary | ICD-10-CM | POA: Diagnosis not present

## 2014-08-23 DIAGNOSIS — I10 Essential (primary) hypertension: Secondary | ICD-10-CM

## 2014-08-23 LAB — COMPLETE METABOLIC PANEL WITH GFR
ALT: 61 U/L — ABNORMAL HIGH (ref 0–53)
AST: 37 U/L (ref 0–37)
Albumin: 4.3 g/dL (ref 3.5–5.2)
Alkaline Phosphatase: 69 U/L (ref 39–117)
BUN: 13 mg/dL (ref 6–23)
CO2: 25 mEq/L (ref 19–32)
Calcium: 9.7 mg/dL (ref 8.4–10.5)
Chloride: 101 mEq/L (ref 96–112)
Creat: 0.9 mg/dL (ref 0.50–1.35)
GFR, Est African American: >89 mL/min
GFR, Est Non African American: >89 mL/min
Glucose, Bld: 86 mg/dL (ref 70–99)
Potassium: 4.7 mEq/L (ref 3.5–5.3)
Sodium: 139 mEq/L (ref 135–145)
Total Bilirubin: 0.6 mg/dL (ref 0.2–1.2)
Total Protein: 6.9 g/dL (ref 6.0–8.3)

## 2014-08-23 LAB — LIPID PANEL
Cholesterol: 152 mg/dL (ref 0–200)
HDL: 23 mg/dL — ABNORMAL LOW (ref >=40)
LDL Cholesterol: 103 mg/dL — ABNORMAL HIGH (ref 0–99)
Total CHOL/HDL Ratio: 6.6 Ratio
Triglycerides: 131 mg/dL (ref <150)
VLDL: 26 mg/dL (ref 0–40)

## 2014-08-23 LAB — POCT GLYCOSYLATED HEMOGLOBIN (HGB A1C): Hemoglobin A1C: 7

## 2014-08-23 NOTE — Progress Notes (Signed)
This chart was scribed for [name], MD by Edison Simon, ED Scribe. This patient was seen in room 26  Patient ID: Melvin Ford MRN: [id number], DOB: [birth year], 49 y.o. Date of Encounter: 08/23/2014, 3:17 PM  Primary Physician: No PCP Per Patient  Chief Complaint:  Chief Complaint  Patient presents with   Follow-up   Diabetes    HPI: 49 y.o. year old male with history below for follow up for type 2 diabetes. He was initially diagnosed with diabetes by me on 07/24/2014 and followed up on 07/27/3014. From note on 3/11: "He states his fatigue and polydipsia have improved somewhat since. Patient does however report a headache that would consistently onset 2-3 hours after taking Glipizide. Nothing made it better. He also reports having stomach cramps for the first 2 days he was taking Glipizide, but those resolved on their own... He hopes to start to work with a dietitian to change his diet as well. Patient states he has previously had success with the Atkins' diet in the past, which forced him to eat more vegetables."  He states he has been doing very well and has been monitoring his glucose. He denies any episodes of hypoglycemia. He stopped using Metformin on April 1; he states he felt well in the morning then crashed in the afternoon though glucose reading was not hypoglycemic. He states he is on a low-carb and high-fat diet and has lost some weight; weight measured 335 on March 11 and is 324 now. He is exercising 20 minutes every day. He previously ate fast food frequently. He denies any new sores or callouses to his feet. He does report pain to his right foot to lateral 5th toe, this improves with rest and worsens with activity. He states he started having decreased vision 3 weeks ago, he had an eye exam and got new glasses. He states vision changed again 3 days ago and he contacted his ophthalmologist again. He states his work involves moderate amount activity and he is active at home as  well.    He also notes that he has had tachycardia, often measured in 120s. It is measured here at 106.    Patient works at a Teacher, English as a foreign language  Past Medical History  Diagnosis Date   Acute meniscal tear of knee left   OSA on CPAP    History of gastric ulcer    History of esophageal stricture    DDD (degenerative disc disease), thoracolumbar    Hypertension NO MEDS   Sleep apnea    Gout      Home Meds: Prior to Admission medications   Medication Sig Start Date End Date Taking? Authorizing Provider  Blood Glucose Monitoring Suppl (ONE TOUCH ULTRA SYSTEM KIT) W/DEVICE KIT 1 kit by Does not apply route once. 07/28/14  Yes [name], MD  glucose blood test strip Use as directed 07/29/14  Yes [name], MD  Lancets [facility] ULTRASOFT) lancets Use as instructed 07/27/14  Yes [name], MD  metroNIDAZOLE (METROGEL) 1 % gel Apply topically daily. 07/27/14  Yes [name], MD  lisinopril (PRINIVIL,ZESTRIL) 20 MG tablet Take 1 tablet (20 mg total) by mouth daily. Patient not taking: Reported on 08/23/2014 07/27/14   [name], MD  metFORMIN (GLUCOPHAGE) 500 MG tablet Take 1 tablet (500 mg total) by mouth 2 (two) times daily with a meal. 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 [employer]   Drug Use: No   Sexual Activity: Yes    Museum/gallery curator: None   Other Topics Concern   Not on file   Social  History Narrative     Review of Systems: Constitutional: negative for chills, fever, night sweats, weight changes, or fatigue  HEENT: negative for hearing loss, congestion, rhinorrhea, ST, epistaxis, or sinus pressure, positive vision change Cardiovascular: negative for chest pain or palpitations Respiratory: negative for hemoptysis, wheezing, shortness of breath, or cough Abdominal: negative for abdominal pain, nausea, vomiting, diarrhea, or constipation Dermatological: negative for rash, wounds Neurologic: negative for headache, dizziness, or syncope Musculoskeletal: positive right foot pain All other systems reviewed and are otherwise negative with the exception to those above and in the HPI.   Physical Exam: Blood pressure 130/87, pulse 106, temperature 98.4 F (36.9 C), resp. rate 16, height 5' 11.5" (1.816 m), weight 324 lb (146.965 kg), SpO2 94 %., Body mass index is 44.56 kg/(m^2). General: Well developed, well nourished, in no acute distress. Head: Normocephalic, atraumatic, eyes without discharge, sclera non-icteric, nares are without discharge. Bilateral auditory canals clear, TM's are without perforation, pearly grey and translucent with reflective cone of light bilaterally. Oral cavity moist, posterior pharynx without exudate, erythema, peritonsillar abscess, or post nasal drip.  Neck: Supple. No thyromegaly. Full ROM. No lymphadenopathy. Lungs: Clear bilaterally to auscultation without wheezes, rales, or rhonchi. Breathing is unlabored. Heart: RRR with S1 S2. No murmurs, rubs, or gallops appreciated. Abdomen: Soft, non-tender, non-distended with normoactive bowel sounds. No hepatomegaly. No rebound/guarding. No obvious abdominal masses. Msk:  Strength and tone normal for age. Extremities/Skin: Warm and dry. No clubbing or cyanosis. No edema. Facial plethora consistent with rosacea.  Some tenderness lateral aspect of midfoot where peroneous brevis inserts.   Neuro: Alert and  oriented X 3. Moves all extremities spontaneously. Gait is normal. CNII-XII grossly in tact. Psych:  Responds to questions appropriately with a normal affect.   Labs: The glucose diary was reviewed showing 3 weeks of blood sugars under 125  Results for orders placed or performed in visit on 07/27/14  POCT glucose (manual entry)  Result Value Ref Range   POC Glucose 221 (A) 70 - 99 mg/dl    ASSESSMENT AND PLAN:  49 y.o. year old male with new onset diabetes that is coming under control with prudent diet and regular exercise.  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      Signed, [name], MD 08/23/2014 3:17 PM

## 2014-08-23 NOTE — Patient Instructions (Addendum)
You're achieving control over diabetes through diet and exercise is quite unusual. Most people with diabetes start out with the best of intentions to ED well and exercise more, but given up shortly after trying. Your foot pain is most consistent with an overuse syndrome, tendinitis of one of the lateral foot muscles. I think if you very your exercise more and limit the elliptical machine to 5-10 minutes, this will go away in 2 weeks  We should recheck the blood pressure and A1C every three months this year.  If all is stable, we'll then check twice a year for a couple years.  It does not appear that you need to take either the metformin or lisinopril at this point.

## 2014-08-24 ENCOUNTER — Other Ambulatory Visit: Payer: Self-pay | Admitting: Family Medicine

## 2014-08-24 LAB — VITAMIN D 25 HYDROXY (VIT D DEFICIENCY, FRACTURES): Vit D, 25-Hydroxy: 22 ng/mL — ABNORMAL LOW (ref 30–100)

## 2014-08-24 MED ORDER — VITAMIN D (ERGOCALCIFEROL) 1.25 MG (50000 UNIT) PO CAPS: 50000.0000 [IU] | ORAL_CAPSULE | ORAL | Status: DC

## 2014-08-28 ENCOUNTER — Encounter: Payer: 59 | Attending: Family Medicine | Admitting: *Deleted

## 2014-08-28 ENCOUNTER — Encounter: Payer: Self-pay | Admitting: *Deleted

## 2014-08-28 VITALS — Ht 72.0 in | Wt 328.0 lb

## 2014-08-28 DIAGNOSIS — Z713 Dietary counseling and surveillance: Secondary | ICD-10-CM | POA: Diagnosis not present

## 2014-08-28 DIAGNOSIS — E119 Type 2 diabetes mellitus without complications: Secondary | ICD-10-CM | POA: Diagnosis present

## 2014-08-28 NOTE — Patient Instructions (Signed)
Plan:  Aim for 3-4 Carb Choices per meal (45-60 grams) +/- 1 either way  Aim for 0-15 Carbs per snack if hungry  Include protein in moderation with your meals and snacks Consider reading food labels for Total Carbohydrate and Fat Grams of foods Consider  increasing your activity level by exercise for 30 minutes daily as tolerated Consider checking BG at alternate times per day as directed by MD   Always combine protein with carbohydrates

## 2014-08-29 NOTE — Progress Notes (Signed)
Diabetes Self-Management Education  Visit Type:  Initial DSME  Appt. Start Time:1400 Appt. End Time: 0272  08/29/2014  Melvin Ford, identified by name and date of birth, is a 49 y.o. male with a diagnosis of Diabetes: Type 2.  Other people present during visit:  Patient   ASSESSMENT  Height 6' (1.829 m), weight 328 lb (148.78 kg). Body mass index is 44.48 kg/(m^2).  Initial Visit Information:  Are you currently following a meal plan?: Yes What type of meal plan do you follow?: High fat(avacado, eggs) , low carb ,protein Are you taking your medications as prescribed?: Not on Medications Are you checking your feet?: Yes How many days per week are you checking your feet?: 7 How often do you need to have someone help you when you read instructions, pamphlets, or other written materials from your doctor or pharmacy?: 1 - Never What is the last grade level you completed in school?: GED  Psychosocial:   Patient Belief/Attitude about Diabetes: Motivated to manage diabetes Self-care barriers: None Self-management support: Doctor's office, CDE visits, Family Other persons present: Patient Patient Concerns: Nutrition/Meal planning, Healthy Lifestyle, Weight Control, Glycemic Control Special Needs: None Preferred Learning Style: No preference indicated Learning Readiness: Ready  Complications:   Last HgB A1C per patient/outside source: 7 mg/dL How often do you check your blood sugar?: 1-2 times/day Fasting Blood glucose range (mg/dL): 70-129 Have you had a dilated eye exam in the past 12 months?: Yes Have you had a dental exam in the past 12 months?: Yes  Diet Intake:  Breakfast: 2 eggs, 1oz sausage, 1/8 red yellow pepper, onion, olives, 1/4-1/2 avacado Snack (morning): none Lunch: salad (greens, peppers, cucumbers, tomatoes, olives,) tuna, chicken Snack (afternoon): fruit (berries) Dinner: 4-8 oz meat, 2 non starchy vegets, spinach, collards, No carbs Snack (evening):  walnuts, macadamia Beverage(s): coffee, whole cream,  Exercise:  Exercise: Light (walking / raking leaves) Light Exercise amount of time (min / week): 120  Individualized Plan for Diabetes Self-Management Training:   Learning Objective:  Patient will have a greater understanding of diabetes self-management. Patient education plan per assessed needs and concerns is to attend individual sessions     Education Topics Reviewed with Patient Today:  Definition of diabetes, type 1 and 2, and the diagnosis of diabetes, Factors that contribute to the development of diabetes Role of diet in the treatment of diabetes and the relationship between the three main macronutrients and blood glucose level Role of exercise on diabetes management, blood pressure control and cardiac health. Reviewed patients medication for diabetes, action, purpose, timing of dose and side effects. Purpose and frequency of SMBG., Daily foot exams, Yearly dilated eye exam Relationship between chronic complications and blood glucose control, Assessed and discussed foot care and prevention of foot problems, Dental care Role of stress on diabetes  PATIENTS GOALS/Plan (Developed by the patient):  Nutrition: General guidelines for healthy choices and portions discussed Physical Activity: Exercise 5-7 days per week, 30 minutes per day Medications: Not Applicable (discontinued medication 08/17/14)   Patient Instructions  Plan:  Aim for 3-4 Carb Choices per meal (45-60 grams) +/- 1 either way  Aim for 0-15 Carbs per snack if hungry  Include protein in moderation with your meals and snacks Consider reading food labels for Total Carbohydrate and Fat Grams of foods Consider  increasing your activity level by exercise for 30 minutes daily as tolerated Consider checking BG at alternate times per day as directed by MD   Always combine protein with carbohydrates  Expected Outcomes:  Demonstrated interest in learning. Expect  positive outcomes  Education material provided: Living Well with Diabetes, A1C conversion sheet, Meal plan card, My Plate and Snack sheet  If problems or questions, patient to contact team via:  Phone  Future DSME appointment: PRN

## 2014-11-26 ENCOUNTER — Encounter: Payer: Self-pay | Admitting: Family Medicine

## 2014-11-26 ENCOUNTER — Ambulatory Visit (INDEPENDENT_AMBULATORY_CARE_PROVIDER_SITE_OTHER): Payer: 59 | Admitting: Family Medicine

## 2014-11-26 VITALS — BP 122/78 | HR 87 | Temp 98.5°F | Resp 18 | Wt 301.0 lb

## 2014-11-26 DIAGNOSIS — R7989 Other specified abnormal findings of blood chemistry: Secondary | ICD-10-CM

## 2014-11-26 DIAGNOSIS — E119 Type 2 diabetes mellitus without complications: Secondary | ICD-10-CM | POA: Diagnosis not present

## 2014-11-26 DIAGNOSIS — L602 Onychogryphosis: Secondary | ICD-10-CM | POA: Diagnosis not present

## 2014-11-26 DIAGNOSIS — Q845 Enlarged and hypertrophic nails: Secondary | ICD-10-CM | POA: Diagnosis not present

## 2014-11-26 DIAGNOSIS — E785 Hyperlipidemia, unspecified: Secondary | ICD-10-CM | POA: Diagnosis not present

## 2014-11-26 LAB — LIPID PANEL
CHOLESTEROL: 144 mg/dL (ref 0–200)
HDL: 29 mg/dL — ABNORMAL LOW (ref >=40)
LDL CALC: 94 mg/dL (ref 0–99)
Total CHOL/HDL Ratio: 5 Ratio
Triglycerides: 103 mg/dL (ref <150)
VLDL: 21 mg/dL (ref 0–40)

## 2014-11-26 LAB — GLUCOSE, POCT (MANUAL RESULT ENTRY): POC Glucose: 86 mg/dl (ref 70–99)

## 2014-11-26 LAB — POCT GLYCOSYLATED HEMOGLOBIN (HGB A1C): Hemoglobin A1C: 5.3

## 2014-11-26 NOTE — Progress Notes (Addendum)
Subjective:    Patient ID: Melvin Ford, male    DOB: [birth year], 49 y.o.   MRN: [id number] This chart was scribed for Merri Ray, MD by Zola Button, Medical Scribe. This patient was seen in Room 25 and the patient's care was started at 5:29 PM.    HPI HPI Comments: Melvin Ford is a 49 y.o. male with a history of DM and hypertension who presents to the [facility] for a follow-up for diabetes.   Diabetes: At last visit with Dr. Joseph Art on April 7th, his weight was 324. He has lost 23 lbs since that time and is at 301 lbs today. He has been followed by nutrition. HgbA1c controlled at 7.0 in April. He stopped medications in April and treated with diet only since that time. He has significantly cut back on carbohydrates and sugar, and he has been watching his diet more carefully. Patient has been on a high fat diet plan with about 60% fat. He does feel he has enough energy throughout the day. He has not been exercising recently. Patient denies lightheadedness, dizziness, chest pain, and SOB. The numbness has resolved in his feet. Lab Results  Component Value Date   CHOL 152 08/23/2014   HDL 23* 08/23/2014   LDLCALC 103* 08/23/2014   TRIG 131 08/23/2014   CHOLHDL 6.6 08/23/2014    Low Vitamin D: Reading of 22 when checked in April. Was treated with 50,000 units per week.  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  Patient Active Problem List   Diagnosis Date Noted  . Family history of malignant neoplasm of gastrointestinal tract 07/24/2011  . Stricture and stenosis of esophagus 07/24/2011  . Essential hypertension 09/12/2007  . ABNORMAL HEART RHYTHMS 09/12/2007  . SLEEP APNEA 09/12/2007  . HEADACHE, CHRONIC 09/12/2007  . DYSPNEA 09/12/2007   Past Medical History  Diagnosis Date  . Acute meniscal tear of knee left  . OSA on CPAP   . History of gastric ulcer   . History of esophageal stricture   . DDD (degenerative disc disease),  thoracolumbar   . Hypertension NO MEDS  . Sleep apnea   . Gout    Past Surgical History  Procedure Laterality Date  . Laparoscopic cholecystectomy  14 yrs ago  . Anterior cruciate ligament repair  1989    RIGHT  . Pilonidal cyst excision  1986  . 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 Knee arthroscopy w/ meniscal repair  06-26-11    left   Allergies  Allergen Reactions  . Allopurinol Other (See Comments)    Long-term effect  Muscle atrophy   Prior to Admission medications   Medication Sig Start Date End Date Taking? Authorizing Provider  Blood Glucose Monitoring Suppl (ONE TOUCH ULTRA SYSTEM KIT) W/DEVICE KIT 1 kit by Does not apply route once. 07/28/14  Yes [name], MD  glucose blood test strip Use as directed 07/29/14  Yes [name], MD  Lancets [facility] ULTRASOFT) lancets Use as instructed 07/27/14  Yes [name], MD  Vitamin D, Ergocalciferol, (DRISDOL) 50000 UNITS CAPS capsule Take 1 capsule (50,000 Units total) by mouth every 7 (seven) days. 08/24/14  Yes [name], MD  lisinopril (PRINIVIL,ZESTRIL) 20 MG tablet Take 1 tablet (20 mg total) by mouth daily. Patient not taking: Reported on 08/23/2014 07/27/14   [name], MD  metFORMIN (GLUCOPHAGE) 500 MG tablet Take 1 tablet (500 mg total) by mouth 2 (two) times daily with a meal. Patient not taking: Reported on 08/23/2014 07/24/14   [name], MD  metroNIDAZOLE (METROGEL) 1 % gel Apply topically daily. Patient not taking: Reported on 11/26/2014 07/27/14   [name], MD   History   Social History  . Marital Status: Married    Spouse Name: N/A  . Number of Children: N/A  . Years of Education: N/A   Occupational History  . Building services engineer    Social History Main Topics  . Smoking status: Never Smoker   . Smokeless tobacco: Never Used  . 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 Drug Use: No  . Sexual Activity: Yes    Birth Control/ Protection: None   Other Topics Concern  . Not on file   Social History Narrative     Review of Systems  Constitutional: Negative for fatigue.  Respiratory: Negative for shortness of breath.   Cardiovascular: Negative for chest pain.  Neurological: Negative for dizziness, light-headedness and numbness.       Objective:   Physical Exam  Constitutional: He is oriented to person, place, and time. He appears well-developed and well-nourished. No distress.  HENT:  Head: Normocephalic and atraumatic.  Mouth/Throat: Oropharynx is clear and moist. No oropharyngeal exudate.  Eyes: Pupils are equal, round, and reactive to light.  Neck: Neck supple.  Cardiovascular: Normal rate.   Pulmonary/Chest: Effort normal.  Abdominal: There is no tenderness.  Musculoskeletal: He exhibits no edema.  Neurological: He is alert and oriented to person, place, and time. No cranial nerve deficit.  Normal microfilament test throughout feet.  Skin: Skin is warm and dry. No rash noted.  Thickened toenails of left great toe, right great toe and right second toe. Some darkening of distal aspect to right and second toes.  Psychiatric: He has a normal mood and affect. His behavior is normal.  Vitals reviewed.   Results for orders placed or performed in visit on 11/26/14  POCT glucose (manual entry)  Result Value Ref Range   POC Glucose 86 70 - 99 mg/dl  POCT glycosylated hemoglobin (Hb A1C)  Result Value Ref Range   Hemoglobin A1C 5.3      Filed Vitals:   11/26/14 1625  BP: 122/78  Pulse: 87  Temp: 98.5 F (36.9 C)  TempSrc: Oral  Resp: 18  Weight: 301 lb (136.533 kg)       Assessment & Plan:   ALDWIN MICALIZZI is a 49 y.o. male Type 2 diabetes mellitus without complication - Plan: POCT glucose (manual entry), POCT glycosylated hemoglobin (Hb A1C),  Lipid panel, Ambulatory referral to Podiatry, Microalbumin, urine  - well controlled. Commended on efforts.  Recommended adding exercise.  Ok for diet control only for now. Recheck in 6 months.  Low serum vitamin D - Plan: Vitamin D, 25-hydroxy  - repeat level. May be able to transition off high dose weekly to daily supplementation only.   Thickened nails - Plan: Ambulatory referral to Podiatry for diabetic nail care.   Hyperlipidemia - Plan: Lipid panel pending.   No orders of the defined types were placed in this encounter.   Patient Instructions  Congrats on your great sugar control!  You should receive a call or letter about your lab results within the next week to  10 days.  I will refer you to podiatry for your nail care. Follow up in 6 months.      I personally performed the services described in this documentation, which was scribed in my presence. The recorded information has been reviewed and considered, and addended by me as needed.

## 2014-11-26 NOTE — Patient Instructions (Signed)
Congrats on your great sugar control!  You should receive a call or letter about your lab results within the next week to 10 days.  I will refer you to podiatry for your nail care. Follow up in 6 months.

## 2014-11-27 LAB — VITAMIN D 25 HYDROXY (VIT D DEFICIENCY, FRACTURES): Vit D, 25-Hydroxy: 39 ng/mL (ref 30–100)

## 2014-11-27 LAB — MICROALBUMIN, URINE: Microalb, Ur: 0.2 mg/dL (ref <2.0)

## 2014-12-31 ENCOUNTER — Encounter: Payer: Self-pay | Admitting: Podiatry

## 2014-12-31 ENCOUNTER — Ambulatory Visit (INDEPENDENT_AMBULATORY_CARE_PROVIDER_SITE_OTHER): Payer: 59 | Admitting: Podiatry

## 2014-12-31 VITALS — BP 130/80 | HR 85 | Resp 18

## 2014-12-31 DIAGNOSIS — M79676 Pain in unspecified toe(s): Secondary | ICD-10-CM

## 2014-12-31 DIAGNOSIS — B351 Tinea unguium: Secondary | ICD-10-CM | POA: Diagnosis not present

## 2014-12-31 DIAGNOSIS — S90229A Contusion of unspecified lesser toe(s) with damage to nail, initial encounter: Secondary | ICD-10-CM | POA: Diagnosis not present

## 2014-12-31 NOTE — Patient Instructions (Signed)
Diabetes and Foot Care Diabetes may cause you to have problems because of poor blood supply (circulation) to your feet and legs. This may cause the skin on your feet to become thinner, break easier, and heal more slowly. Your skin may become dry, and the skin may peel and crack. You may also have nerve damage in your legs and feet causing decreased feeling in them. You may not notice minor injuries to your feet that could lead to infections or more serious problems. Taking care of your feet is one of the most important things you can do for yourself.  HOME CARE INSTRUCTIONS  Wear shoes at all times, even in the house. Do not go barefoot. Bare feet are easily injured.  Check your feet daily for blisters, cuts, and redness. If you cannot see the bottom of your feet, use a mirror or ask someone for help.  Wash your feet with warm water (do not use hot water) and mild soap. Then pat your feet and the areas between your toes until they are completely dry. Do not soak your feet as this can dry your skin.  Apply a moisturizing lotion or petroleum jelly (that does not contain alcohol and is unscented) to the skin on your feet and to dry, brittle toenails. Do not apply lotion between your toes.  Trim your toenails straight across. Do not dig under them or around the cuticle. File the edges of your nails with an emery board or nail file.  Do not cut corns or calluses or try to remove them with medicine.  Wear clean socks or stockings every day. Make sure they are not too tight. Do not wear knee-high stockings since they may decrease blood flow to your legs.  Wear shoes that fit properly and have enough cushioning. To break in new shoes, wear them for just a few hours a day. This prevents you from injuring your feet. Always look in your shoes before you put them on to be sure there are no objects inside.  Do not cross your legs. This may decrease the blood flow to your feet.  If you find a minor scrape,  cut, or break in the skin on your feet, keep it and the skin around it clean and dry. These areas may be cleansed with mild soap and water. Do not cleanse the area with peroxide, alcohol, or iodine.  When you remove an adhesive bandage, be sure not to damage the skin around it.  If you have a wound, look at it several times a day to make sure it is healing.  Do not use heating pads or hot water bottles. They may burn your skin. If you have lost feeling in your feet or legs, you may not know it is happening until it is too late.  Make sure your health care provider performs a complete foot exam at least annually or more often if you have foot problems. Report any cuts, sores, or bruises to your health care provider immediately. SEEK MEDICAL CARE IF:   You have an injury that is not healing.  You have cuts or breaks in the skin.  You have an ingrown nail.  You notice redness on your legs or feet.  You feel burning or tingling in your legs or feet.  You have pain or cramps in your legs and feet.  Your legs or feet are numb.  Your feet always feel cold. SEEK IMMEDIATE MEDICAL CARE IF:   There is increasing redness,   swelling, or pain in or around a wound.  There is a red line that goes up your leg.  Pus is coming from a wound.  You develop a fever or as directed by your health care provider.  You notice a bad smell coming from an ulcer or wound. Document Released: 05/01/2000 Document Revised: 01/04/2013 Document Reviewed: 10/11/2012 ExitCare Patient Information 2015 [employer]. This information is not intended to replace advice given to you by your health care provider. Make sure you discuss any questions you have with your health care provider.  

## 2014-12-31 NOTE — Progress Notes (Signed)
   Subjective:    Patient ID: Melvin Ford, male    DOB: 1966-02-09, 49 y.o.   MRN: [id number]  HPI  49 year old male presents the office every diabetic risk assessment and for painful elongated toenails which he is able to trim himself. He states his last A1c was 5.3. He denies any history of ulceration. He states he previously had some numbness to his feet however since his blood sugars been under better control and he has lost weight and numbness has improved. Denies any claudication symptoms. No other complaints at this time.   Review of Systems  All other systems reviewed and are negative.      Objective:   Physical Exam AAO x3, NAD DP/PT pulses palpable bilaterally, CRT less than 3 seconds Protective sensation intact with Simms Weinstein monofilament, vibratory sensation intact, Achilles tendon reflex intact Nails 1-5 on the right foot in a hallux nail left foot is hypertrophic, dystrophic, brittle, discolored, elongated. There isevidence of a small amount of subungual hematoma under bilateral hallux nails. There is tenderness to palpation around the nails 6. No surrounding erythema, drainage or other signs of infection. No other areas of tenderness to bilateral lower extremities. MMT 5/5, ROM WNL.  No open lesions or pre-ulcerative lesions.  No overlying edema, erythema, increase in warmth to bilateral lower extremities.  No pain with calf compression, swelling, warmth, erythema bilaterally.      Assessment & Plan:  49 year old male symptomatic onychomycosis, diabetic risk assessment -Treatment options discussed including all alternatives, risks, and complications -Nail sharply debrided 6 without complications on splitting -Discussed importance daily foot inspection -Follow-up 6 months or sooner if any problems arise. In the meantime, encouraged to call the office with any questions, concerns, change in symptoms.   Celesta Gentile, DPM

## 2015-01-01 ENCOUNTER — Encounter: Payer: Self-pay | Admitting: Podiatry

## 2015-06-03 ENCOUNTER — Ambulatory Visit: Payer: 59 | Admitting: Family Medicine

## 2015-11-23 ENCOUNTER — Ambulatory Visit (INDEPENDENT_AMBULATORY_CARE_PROVIDER_SITE_OTHER): Payer: 59 | Admitting: Osteopathic Medicine

## 2015-11-23 VITALS — BP 130/82 | HR 70 | Temp 99.0°F | Resp 18 | Ht 72.0 in | Wt 322.0 lb

## 2015-11-23 DIAGNOSIS — S46219A Strain of muscle, fascia and tendon of other parts of biceps, unspecified arm, initial encounter: Secondary | ICD-10-CM | POA: Insufficient documentation

## 2015-11-23 DIAGNOSIS — E119 Type 2 diabetes mellitus without complications: Secondary | ICD-10-CM

## 2015-11-23 DIAGNOSIS — S46211A Strain of muscle, fascia and tendon of other parts of biceps, right arm, initial encounter: Secondary | ICD-10-CM

## 2015-11-23 LAB — POCT GLYCOSYLATED HEMOGLOBIN (HGB A1C): HEMOGLOBIN A1C: 5.6

## 2015-11-23 NOTE — Patient Instructions (Signed)
     IF you received an x-ray today, you will receive an invoice from [facility]. Please contact [facility] at [phone] with questions or concerns regarding your invoice.   IF you received labwork today, you will receive an invoice from [facility]. Please contact [facility] at [phone] with questions or concerns regarding your invoice.   Our billing staff will not be able to assist you with questions regarding bills from these companies.  You will be contacted with the lab results as soon as they are available. The fastest way to get your results is to activate your My Chart account. Instructions are located on the last page of this paperwork. If you have not heard from us regarding the results in 2 weeks, please contact this office.      

## 2015-11-23 NOTE — Progress Notes (Signed)
HPI: Melvin Ford is a 50 y.o. male who presents to [facility] 11/23/2015 for chief complaint of:  Chief Complaint  Patient presents with  . Elbow Pain    last sunday     Elbow/Arm injury . Context: Previous injury about a month ago, patient was lifting/moving heavy barrel when it fell he hyperextended his right elbow. Note impact, there is no bruising at that time. Since the initial injury he has felt some tearing in the upper extremity in the area of the distal insertion of the biceps tendon and earlier this week had more severe ripping feeling and then bruising started to develop . Quality: Soreness/bruising . Duration/Timing: initial injury happened 10/23/13, bruising happened 11/18/15 . Assoc signs/symptoms: No significant swelling, no rash or ulceration, no joint pain. Having some difficulty with lifting and pulling motions on the right arm  Type 2 diabetes: Patient requests A1c test while he is here. He is managing diabetes with diet, admits to not being as compliant as he should be   Past medical, social and family history reviewed: Past Medical History  Diagnosis Date  . Acute meniscal tear of knee left  . OSA on CPAP   . History of gastric ulcer   . History of esophageal stricture   . DDD (degenerative disc disease), thoracolumbar   . Hypertension NO MEDS  . Sleep apnea   . Gout    Past Surgical History  Procedure Laterality Date  . Laparoscopic cholecystectomy  14 yrs ago  . Anterior cruciate ligament repair  1989    RIGHT  . Pilonidal cyst excision  1986  . 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 Knee arthroscopy w/ meniscal repair  06-26-11    left   Social History  Substance Use Topics  . Smoking status: Never Smoker   . Smokeless tobacco: Never Used  . 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 Colon cancer Mother 12  . Diabetes Mother   . Colon cancer Maternal Grandmother 64  . Diabetes Paternal Grandfather   . Heart disease Paternal Grandfather   . Irritable bowel syndrome Paternal Grandmother     Current Outpatient Prescriptions  Medication Sig Dispense Refill  . acetaminophen (TYLENOL) 325 MG tablet Take 650 mg by mouth every 6 (six) hours as needed.    Marland Kitchen ibuprofen (ADVIL,MOTRIN) 100 MG tablet Take 100 mg by mouth every 6 (six) hours as needed for fever.    . Blood Glucose Monitoring Suppl (ONE TOUCH ULTRA SYSTEM KIT) W/DEVICE KIT 1 kit by Does not apply route once. (Patient not taking: Reported on 11/23/2015) 1 each 0  . glucose blood test strip Use as directed (Patient not taking: Reported on 11/23/2015) 100 each 12  . Lancets (ONETOUCH ULTRASOFT) lancets Use as instructed (Patient not taking: Reported on 11/23/2015) 100 each 12  . Vitamin D, Ergocalciferol, (DRISDOL) 50000 UNITS CAPS capsule Take 1 capsule (50,000 Units total) by mouth every 7 (seven) days. (Patient not taking: Reported on 12/31/2014) 30 capsule 1   No current facility-administered medications for this visit.   Allergies  Allergen Reactions  . Allopurinol Other (See Comments)    Long-term effect  Muscle atrophy  Review of Systems: HEAD/EYES/EARS/NOSE/THROAT: No  headache, no vision change,  CARDIAC: No  chest pain RESPIRATORY:No  shortness of breath MUSCULOSKELETAL: (+) Myalgia/arthralgia as per HPI SKIN: (+) Bruising/ecchymosis on right upper extremity . No  rash/wounds/concerning lesions ENDOCRINE: No polyuria/polydipsia/polyphagia, No  heat/cold intolerance  NEUROLOGIC: No generalized weakness, No  Dizziness, no numbness or tingling down the arm   Exam:  BP 130/82 mmHg  Pulse 70  Temp(Src) 99 F (37.2 C) (Oral)  Resp 18  Ht 6' (1.829 m)  Wt 322 lb (146.058 kg)  BMI 43.66 kg/m2   SpO2 95% Constitutional: VS see above. General Appearance: alert, well-developed, well-nourished, NAD Respiratory: Normal respiratory effort. Musculoskeletal: Hook Test for palpation of biceps tendon, unable to palpate on right arm, normal biceps tendon on left arm. No joint effusions. Gait normal. No clubbing/cyanosis of digits.  Neurological: No cranial nerve deficit on limited exam. Motor and sensation intact and symmetric Skin: Significant ecchymosis on medial aspect of right upper extremity and acidity of lateral epicondyle and biceps triceps area. Skin is otherwise warm, dry, intact. No rash/ulcer. No concerning nevi or subq nodules on limited exam.   Psychiatric: Normal judgment/insight. Normal mood and affect. Oriented x3.    Results for orders placed or performed in visit on 11/23/15 (from the past 72 hour(s))  POCT glycosylated hemoglobin (Hb A1C)     Status: None   Collection Time: 11/23/15  2:53 PM  Result Value Ref Range   Hemoglobin A1C 5.6       ASSESSMENT/PLAN:  Given that the patient's initial injury happened over a month ago, questionable whether he would benefit from surgery at this point, although he certainly may have had a partial tear on his initial injury and fully toward earlier this week prior to the element of significant bruising/ecchymosis. Referral placed to sports medicine, please they will be able to do some further evaluation with ultrasound and can talk to the patient more about whether MRI/surgery is indicated. Patient advised to call this office if he does not hear about referral sometime in the next few days.  Biceps tendon rupture, traumatic, right, initial encounter - Plan: Ambulatory referral to Sports Medicine  Controlled type 2 diabetes mellitus without complication, without long-term current use of insulin ([place]) - Plan: POCT glycosylated hemoglobin (Hb A1C)   Visit summary printed and instructions reviewed with the patient. ER/RTC precautions were  reviewed. All questions answered. Return in about 6 months (around 05/25/2016) for DIABETES FOLLOWUP / ANNUAL WELLNESS EXAM.

## 2015-11-27 ENCOUNTER — Ambulatory Visit (INDEPENDENT_AMBULATORY_CARE_PROVIDER_SITE_OTHER): Payer: 59 | Admitting: Family Medicine

## 2015-11-27 ENCOUNTER — Encounter: Payer: Self-pay | Admitting: Family Medicine

## 2015-11-27 VITALS — BP 156/94 | HR 86 | Ht 72.0 in | Wt 320.0 lb

## 2015-11-27 DIAGNOSIS — S46211D Strain of muscle, fascia and tendon of other parts of biceps, right arm, subsequent encounter: Secondary | ICD-10-CM

## 2015-11-27 DIAGNOSIS — S46211A Strain of muscle, fascia and tendon of other parts of biceps, right arm, initial encounter: Secondary | ICD-10-CM | POA: Diagnosis not present

## 2015-12-02 NOTE — Assessment & Plan Note (Signed)
Right distal biceps tendon rupture - confirmed with ultrasound and has retraction.  Difficult to know for sure however when he completely tore this tendon given he's had 4 separate incidents.  Will refer urgently to orthopedic surgery to discuss repair.  Ideally this is performed within 2-3 weeks but, again, we don't know exactly when this was ruptured.  On extension of elbow appears to have up to 8cm retraction already.  We discussed what conservative measures would entail though he would have great loss of strength.  Icing, tylenol/nsaids as needed in meantime.  Sent to Dr. Mardelle Matte to see him today.

## 2015-12-02 NOTE — Progress Notes (Signed)
PCP: [name], MD Consultation requested by Dr. Emeterio Reeve  Subjective:   HPI: Patient is a 50 y.o. male here for right arm injury.  Patient reports about 1 month ago he was moving a heavy cabinet, felt like elbow popped anteriorly when the cabinet shifted. No treatment or evaluation at the time - reports no bruising or swelling then and was still very functional, very little pain. Since then reports 3 other separate injuries - worst one was 10 days ago when he was pulling himself up and felt a rip antecubital area with significant swelling and bruising - also had one this past Friday when taking out garbage. Difficulty sleeping. Strength decreased. Pain level 2/10 at rest. Is right handed. No issues with this arm prior to a month ago. No numbness, tingling, redness.  Past Medical History  Diagnosis Date  . Acute meniscal tear of knee left  . OSA on CPAP   . History of gastric ulcer   . History of esophageal stricture   . DDD (degenerative disc disease), thoracolumbar   . Hypertension NO MEDS  . Sleep apnea   . Gout     Current Outpatient Prescriptions on File Prior to Visit  Medication Sig Dispense Refill  . acetaminophen (TYLENOL) 325 MG tablet Take 650 mg by mouth every 6 (six) hours as needed.    . Blood Glucose Monitoring Suppl (ONE TOUCH ULTRA SYSTEM KIT) W/DEVICE KIT 1 kit by Does not apply route once. (Patient not taking: Reported on 11/23/2015) 1 each 0  . glucose blood test strip Use as directed (Patient not taking: Reported on 11/23/2015) 100 each 12  . ibuprofen (ADVIL,MOTRIN) 100 MG tablet Take 100 mg by mouth every 6 (six) hours as needed for fever.    . Lancets (ONETOUCH ULTRASOFT) lancets Use as instructed (Patient not taking: Reported on 11/23/2015) 100 each 12  . Vitamin D, Ergocalciferol, (DRISDOL) 50000 UNITS CAPS capsule Take 1 capsule (50,000 Units total) by mouth every 7 (seven) days. (Patient not taking: Reported on 12/31/2014) 30 capsule 1   No  current facility-administered medications on file prior to visit.    Past Surgical History  Procedure Laterality Date  . Laparoscopic cholecystectomy  14 yrs ago  . Anterior cruciate ligament repair  1989    RIGHT  . Pilonidal cyst excision  1986  . 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 Knee arthroscopy w/ meniscal repair  06-26-11    left    Allergies  Allergen Reactions  . Allopurinol Other (See Comments)    Long-term effect  Muscle atrophy    Social History   Social History  . Marital Status: Married    Spouse Name: N/A  . Number of Children: N/A  . Years of Education: N/A   Occupational History  . Building services engineer    Social History Main Topics  . Smoking status: Never Smoker   . Smokeless tobacco: Never Used  . 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 Drug Use: No  . Sexual Activity: Yes    Birth Control/ Protection: None   Other Topics Concern  . Not on file   Social History Narrative    Family History  Problem Relation Age of Onset  . Colon cancer Mother 42  . Diabetes  Mother   . Colon cancer Maternal Grandmother 18  . Diabetes Paternal Grandfather   . Heart disease Paternal Grandfather   . Irritable bowel syndrome Paternal Grandmother     BP 156/94 mmHg  Pulse 86  Ht 6' (1.829 m)  Wt 320 lb (145.151 kg)  BMI 43.39 kg/m2  Review of Systems: See HPI above.    Objective:  Physical Exam:  Gen: NAD, comfortable in exam room  Right arm: Significant bruising, mild-mod swelling antecubital area and medial elbow.  No reverse popeye deformity but noticeable decrease in muscle architecture, bulk with elbow flexion and supination. TTP distal biceps area, antecubital fossa.  No other tenderness including medial/lateral epicondyles. FROM but 4/5  strength with elbow flexion and supination - supination especially painful. Collateral ligaments intact. NVI distally.  MSK u/s right elbow:  With elbow extended distal biceps tendon appears to end about 8cm proximal to its expected insertion on radial tuberosity.  Anechoic surrounding this distal biceps area consistent with large deep hematoma, swelling.  Brachialis appears normal.  No other abnormalities noted.      Assessment & Plan:  1. Right distal biceps tendon rupture - confirmed with ultrasound and has retraction.  Difficult to know for sure however when he completely tore this tendon given he's had 4 separate incidents.  Will refer urgently to orthopedic surgery to discuss repair.  Ideally this is performed within 2-3 weeks but, again, we don't know exactly when this was ruptured.  On extension of elbow appears to have up to 8cm retraction already.  We discussed what conservative measures would entail though he would have great loss of strength.  Icing, tylenol/nsaids as needed in meantime.  Sent to Dr. Mardelle Matte to see him today.

## 2016-05-29 ENCOUNTER — Encounter: Payer: Self-pay | Admitting: Gastroenterology

## 2016-11-10 ENCOUNTER — Encounter (HOSPITAL_COMMUNITY): Payer: Self-pay | Admitting: Emergency Medicine

## 2016-11-10 ENCOUNTER — Ambulatory Visit (HOSPITAL_COMMUNITY)
Admission: EM | Admit: 2016-11-10 | Discharge: 2016-11-10 | Disposition: A | Discharge status: Home / Self Care | Payer: 59 | Attending: Family Medicine | Admitting: Family Medicine

## 2016-11-10 DIAGNOSIS — M62830 Muscle spasm of back: Secondary | ICD-10-CM | POA: Diagnosis not present

## 2016-11-10 DIAGNOSIS — I10 Essential (primary) hypertension: Secondary | ICD-10-CM | POA: Diagnosis not present

## 2016-11-10 MED ORDER — KETOROLAC TROMETHAMINE 60 MG/2ML IM SOLN
60.0000 mg | Freq: Once | INTRAMUSCULAR | Status: AC
  Administered 2016-11-10: 60 mg via INTRAMUSCULAR

## 2016-11-10 MED ORDER — KETOROLAC TROMETHAMINE 60 MG/2ML IM SOLN
INTRAMUSCULAR | Status: AC
  Filled 2016-11-10: qty 2

## 2016-11-10 MED ORDER — DIAZEPAM 10 MG PO TABS: 10.0000 mg | ORAL_TABLET | Freq: Four times a day (QID) | ORAL | 0 refills | Status: AC | PRN

## 2016-11-10 NOTE — ED Triage Notes (Signed)
The patient presented to the [facility] with a complaint of muscle spasms in his back related to chronic back pain.

## 2016-11-11 NOTE — ED Provider Notes (Signed)
  [place]   [id number] 11/10/16 Arrival Time: 1926  ASSESSMENT & PLAN:  1. Muscle spasm of back   2. Essential hypertension     Meds ordered this encounter  Medications  . naproxen sodium (ANAPROX) 220 MG tablet    Sig: Take 220 mg by mouth 2 (two) times daily with a meal.  . diazepam (VALIUM) 10 MG tablet    Sig: Take 1 tablet (10 mg total) by mouth every 6 (six) hours as needed (for muscle spasm).    Dispense:  10 tablet    Refill:  0  . ketorolac (TORADOL) injection 60 mg   Notice increased BP in office. Discussed. Unsure if he has taken BP meds today. Pain also contributing.  Medication sedation precautions. Ensure mobility as able. Reviewed expectations re: course of current medical issues. Questions answered. Outlined signs and symptoms indicating need for more acute intervention. Follow up here or in the Emergency Department if worsening. Patient verbalized understanding. After Visit Summary given.  SUBJECTIVE:  Melvin Ford is a 51 y.o. male who presents with complaint of acute onset R mid back muscle spasm and sharp pain. Today. No specific trigger. No injury or trauma. Has experienced before. OTC without relief. Movement worsens pain. No respiratory difficulties. No radiation of pain. No n/v. Ambulatory but has to move slowly.  ROS: As per HPI.   OBJECTIVE:  Vitals:   11/10/16 1942  BP: (!) 189/102  Pulse: 98  Resp: 20  Temp: 98.1 F (36.7 C)  TempSrc: Oral  SpO2: 95%     General appearance: alert, cooperative; appears to be in significant discomfort; seated in wheelchair holding R mid back Head: Normocephalic, without obvious abnormality, atraumatic Eyes: pupils normal Neck: FROM Back: very tender over mid back paraspinal musculature; no midline tenderness Lungs: clear to auscultation bilaterally Chest wall: no tenderness Skin: warm and dry   Allergies  Allergen Reactions  . Allopurinol Other (See Comments)    Long-term effect   Muscle atrophy    PMHx, SurgHx, SocialHx, Medications, and Allergies were reviewed in the Visit Navigator and updated as appropriate.       Vanessa Kick, MD 11/11/16 1105

## 2016-11-16 ENCOUNTER — Telehealth (HOSPITAL_COMMUNITY): Payer: Self-pay | Admitting: Family Medicine

## 2016-11-16 NOTE — Telephone Encounter (Signed)
Pt called requesting something stronger for pain in his back. sts that he has been using naproxen, heat and the muscle relaxer that were prescribed but not better. Sts he is making an appointment with Percell Miller and Noemi Chapel. Spoke with Doctor Hagler and prescribing Norco for pain. Pt sts wife will be later to pick up.

## 2016-11-20 ENCOUNTER — Other Ambulatory Visit: Payer: Self-pay | Admitting: Orthopedic Surgery

## 2016-11-20 DIAGNOSIS — M5136 Other intervertebral disc degeneration, lumbar region: Secondary | ICD-10-CM

## 2016-11-29 ENCOUNTER — Ambulatory Visit
Admission: RE | Admit: 2016-11-29 | Discharge: 2016-11-29 | Disposition: A | Discharge status: Home / Self Care | Payer: 59 | Source: Ambulatory Visit | Attending: Orthopedic Surgery | Admitting: Orthopedic Surgery

## 2016-11-29 DIAGNOSIS — M5136 Other intervertebral disc degeneration, lumbar region: Secondary | ICD-10-CM

## 2016-12-07 ENCOUNTER — Other Ambulatory Visit: Payer: Self-pay | Admitting: Physical Medicine and Rehabilitation

## 2016-12-07 DIAGNOSIS — M545 Low back pain: Principal | ICD-10-CM

## 2016-12-07 DIAGNOSIS — G8929 Other chronic pain: Secondary | ICD-10-CM

## 2016-12-16 ENCOUNTER — Ambulatory Visit
Admission: RE | Admit: 2016-12-16 | Discharge: 2016-12-16 | Disposition: A | Discharge status: Home / Self Care | Payer: 59 | Source: Ambulatory Visit | Attending: Physical Medicine and Rehabilitation | Admitting: Physical Medicine and Rehabilitation

## 2016-12-16 ENCOUNTER — Other Ambulatory Visit: Payer: Self-pay | Admitting: Physical Medicine and Rehabilitation

## 2016-12-16 DIAGNOSIS — G8929 Other chronic pain: Secondary | ICD-10-CM

## 2016-12-16 DIAGNOSIS — M545 Low back pain: Principal | ICD-10-CM

## 2016-12-16 MED ORDER — IOPAMIDOL (ISOVUE-M 200) INJECTION 41%
1.0000 mL | Freq: Once | INTRAMUSCULAR | Status: AC
  Administered 2016-12-16: 1 mL via INTRA_ARTICULAR

## 2016-12-16 MED ORDER — METHYLPREDNISOLONE ACETATE 40 MG/ML INJ SUSP (RADIOLOG
120.0000 mg | Freq: Once | INTRAMUSCULAR | Status: AC
  Administered 2016-12-16: 120 mg via INTRA_ARTICULAR

## 2016-12-16 NOTE — Discharge Instructions (Signed)
